# Patient Record
Sex: Female | Born: 1955 | Race: White | Hispanic: No | Marital: Married | Smoking: Never smoker
Health system: Southern US, Community
[De-identification: ages and names within clinical notes are randomized; demographics above are authoritative.]

## PROBLEM LIST (undated history)

## (undated) DIAGNOSIS — R51 Headache: Secondary | ICD-10-CM

## (undated) DIAGNOSIS — I10 Essential (primary) hypertension: Secondary | ICD-10-CM

---
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
## 1996-10-15 HISTORY — PX: OTHER SURGICAL HISTORY: SHX169

## 1997-10-15 HISTORY — PX: ABDOMINAL HYSTERECTOMY: SHX81

## 1998-10-15 HISTORY — PX: OTHER SURGICAL HISTORY: SHX169

## 1998-12-27 ENCOUNTER — Encounter: Payer: Self-pay | Admitting: Emergency Medicine

## 1998-12-27 ENCOUNTER — Emergency Department (HOSPITAL_COMMUNITY): Admission: EM | Admit: 1998-12-27 | Discharge: 1998-12-27 | Payer: Self-pay | Admitting: Emergency Medicine

## 1999-01-12 ENCOUNTER — Inpatient Hospital Stay (HOSPITAL_COMMUNITY): Admission: RE | Admit: 1999-01-12 | Discharge: 1999-01-14 | Payer: Self-pay | Admitting: Specialist

## 1999-01-12 ENCOUNTER — Encounter: Payer: Self-pay | Admitting: Specialist

## 1999-04-27 ENCOUNTER — Encounter: Admission: RE | Admit: 1999-04-27 | Discharge: 1999-07-26 | Payer: Self-pay | Admitting: Anesthesiology

## 2006-10-15 HISTORY — PX: CYSTOCELE REPAIR: SHX163

## 2009-10-15 HISTORY — PX: BACK SURGERY: SHX140

## 2009-10-15 HISTORY — PX: HERNIA REPAIR: SHX51

## 2010-08-30 ENCOUNTER — Encounter: Admission: RE | Admit: 2010-08-30 | Discharge: 2010-08-30 | Payer: Self-pay | Admitting: Specialist

## 2010-10-04 ENCOUNTER — Ambulatory Visit (HOSPITAL_COMMUNITY)
Admission: RE | Admit: 2010-10-04 | Discharge: 2010-10-05 | Payer: Self-pay | Source: Home / Self Care | Attending: Specialist | Admitting: Specialist

## 2010-12-25 LAB — COMPREHENSIVE METABOLIC PANEL
ALT: 22 U/L (ref 0–35)
AST: 17 U/L (ref 0–37)
Albumin: 3.9 g/dL (ref 3.5–5.2)
Alkaline Phosphatase: 72 U/L (ref 39–117)
Calcium: 9.6 mg/dL (ref 8.4–10.5)
Chloride: 107 mEq/L (ref 96–112)
Total Bilirubin: 0.5 mg/dL (ref 0.3–1.2)
Total Protein: 7.6 g/dL (ref 6.0–8.3)

## 2010-12-25 LAB — CBC
Hemoglobin: 13 g/dL (ref 12.0–15.0)
MCHC: 32.2 g/dL (ref 30.0–36.0)
MCV: 85.4 fL (ref 78.0–100.0)
Platelets: 244 10*3/uL (ref 150–400)
RDW: 13.9 % (ref 11.5–15.5)

## 2010-12-25 LAB — URINALYSIS, ROUTINE W REFLEX MICROSCOPIC
Bilirubin Urine: NEGATIVE
Glucose, UA: NEGATIVE mg/dL
Urobilinogen, UA: 0.2 mg/dL (ref 0.0–1.0)
pH: 5.5 (ref 5.0–8.0)

## 2010-12-25 LAB — PROTIME-INR: INR: 0.97 (ref 0.00–1.49)

## 2010-12-25 LAB — APTT: aPTT: 30 seconds (ref 24–37)

## 2011-12-24 IMAGING — CR DG LUMBAR SPINE 2-3V
2 series · 2 of 2 positions shown · non-contrast
Comparison: Mildly 08/30/2010

CLINICAL DATA: L4-5 stenosis, preop.

LUMBAR SPINE - 2-3 VIEW

[t l-spine a.p. *]
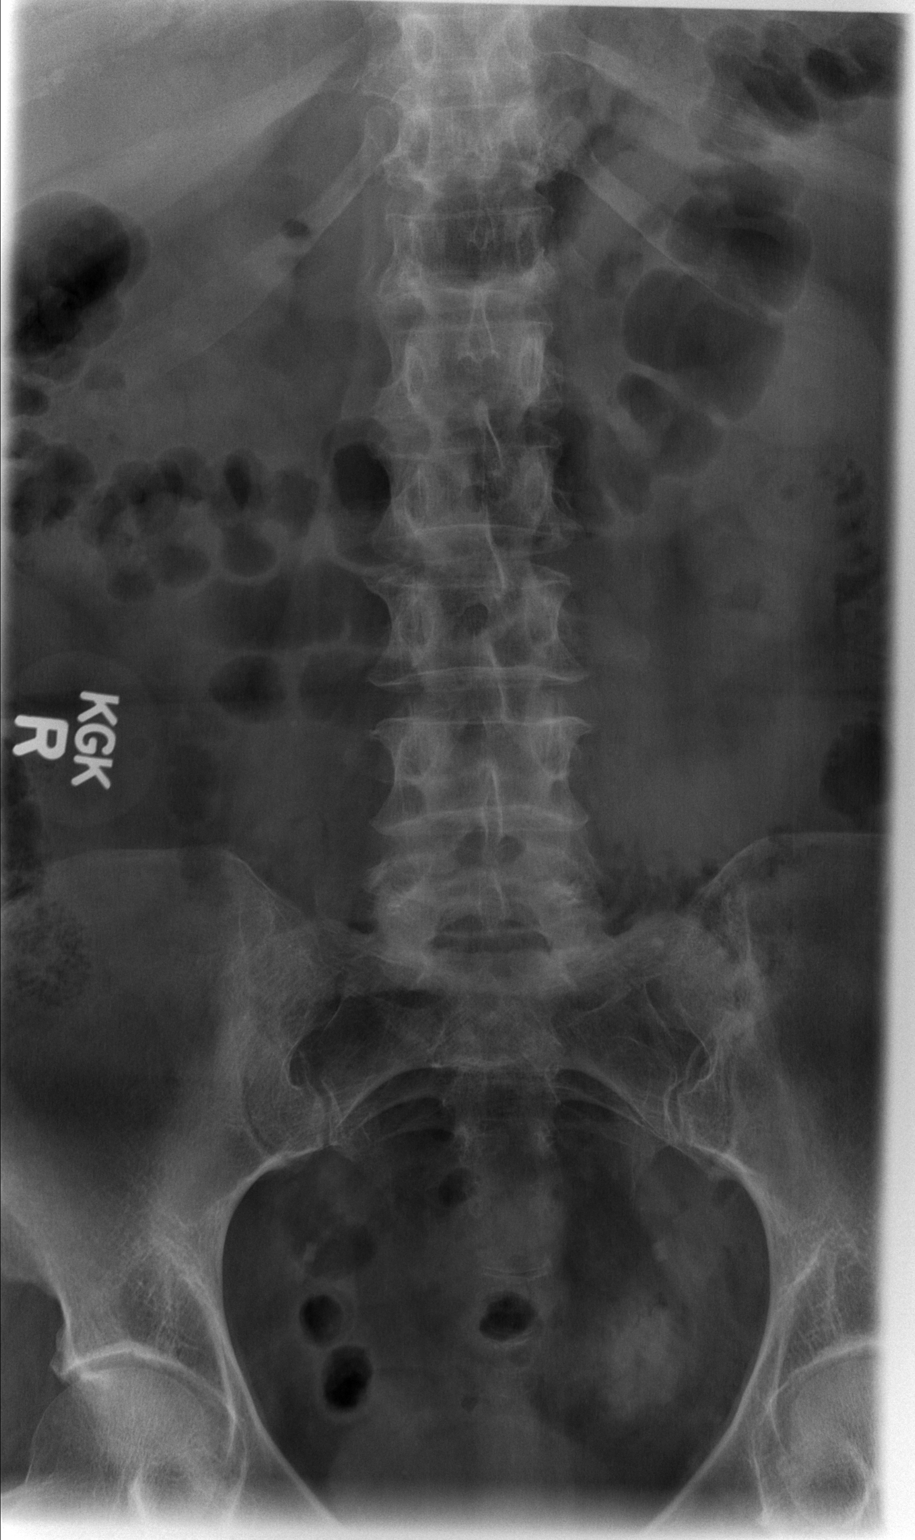

[t l-spine lat *]
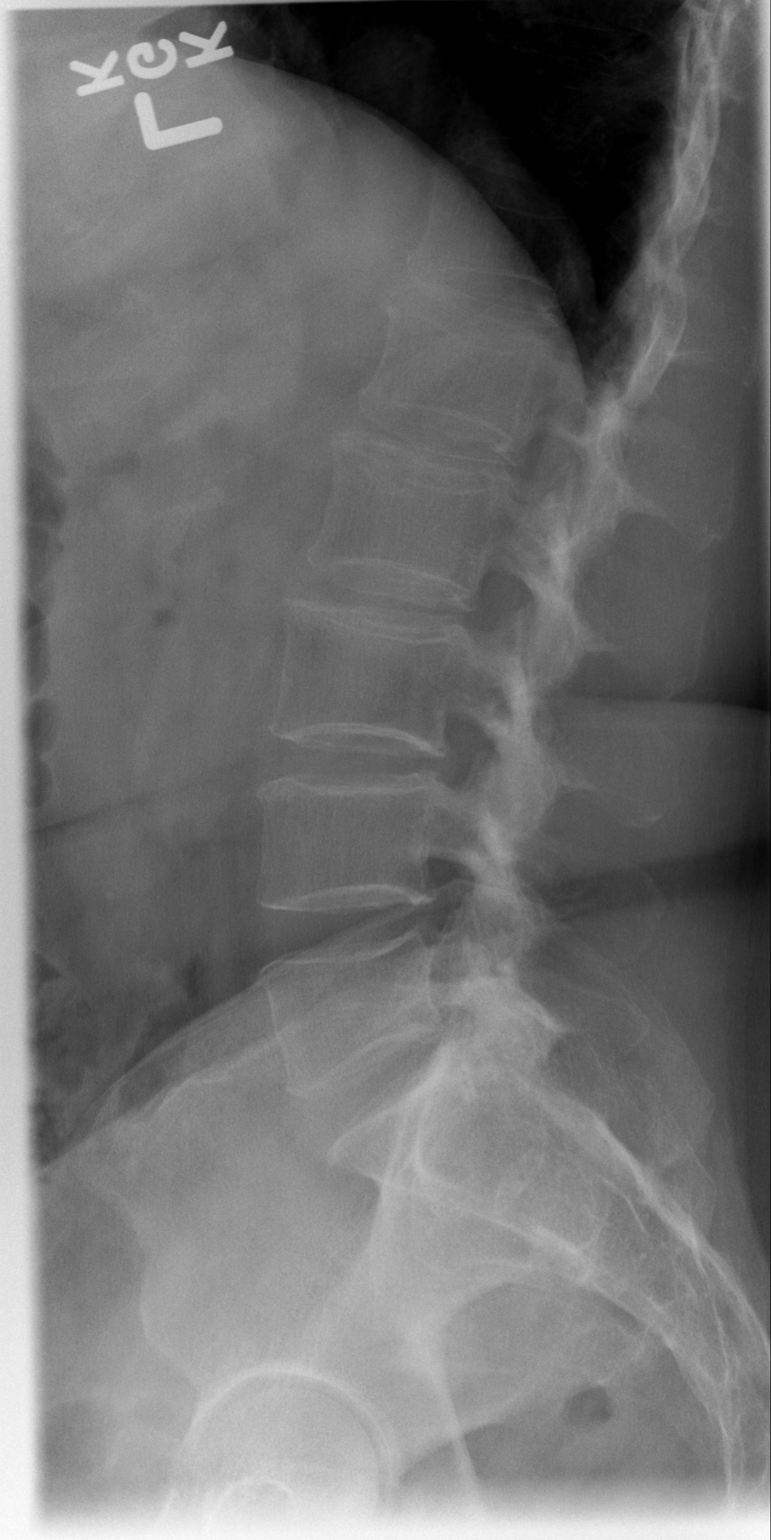

[2 of 2 positions shown; findings below may reference images not displayed]

FINDINGS: There is normal alignment.  Mild osteopenia.
Degenerative facet disease throughout the lumbar spine, most
pronounced in the lower lumbar spine.  Disc spaces are maintained.
IMPRESSION: No acute bony abnormality.  Degenerative facet disease.

## 2012-09-15 ENCOUNTER — Other Ambulatory Visit: Payer: Self-pay | Admitting: Specialist

## 2012-09-15 DIAGNOSIS — M48061 Spinal stenosis, lumbar region without neurogenic claudication: Secondary | ICD-10-CM

## 2012-09-26 ENCOUNTER — Other Ambulatory Visit: Payer: Self-pay | Admitting: Specialist

## 2012-09-26 ENCOUNTER — Inpatient Hospital Stay
Admission: RE | Admit: 2012-09-26 | Discharge: 2012-09-26 | Disposition: A | Discharge status: Home / Self Care | Payer: Self-pay | Source: Ambulatory Visit | Attending: Specialist | Admitting: Specialist

## 2012-09-26 ENCOUNTER — Ambulatory Visit
Admission: RE | Admit: 2012-09-26 | Discharge: 2012-09-26 | Disposition: A | Discharge status: Home / Self Care | Payer: BC Managed Care – PPO | Source: Ambulatory Visit | Attending: Specialist | Admitting: Specialist

## 2012-09-26 VITALS — BP 136/67 | HR 63

## 2012-09-26 DIAGNOSIS — M48061 Spinal stenosis, lumbar region without neurogenic claudication: Secondary | ICD-10-CM

## 2012-09-26 DIAGNOSIS — M549 Dorsalgia, unspecified: Secondary | ICD-10-CM

## 2012-09-26 MED ORDER — IOHEXOL 180 MG/ML  SOLN
15.0000 mL | Freq: Once | INTRAMUSCULAR | Status: AC | PRN
  Administered 2012-09-26: 15 mL via INTRATHECAL

## 2012-09-26 MED ORDER — OXYCODONE-ACETAMINOPHEN 5-325 MG PO TABS
1.0000 | ORAL_TABLET | Freq: Once | ORAL | Status: AC
  Administered 2012-09-26: 1 via ORAL

## 2012-09-26 MED ORDER — DIAZEPAM 5 MG PO TABS
10.0000 mg | ORAL_TABLET | Freq: Once | ORAL | Status: AC
  Administered 2012-09-26: 10 mg via ORAL

## 2013-02-02 ENCOUNTER — Other Ambulatory Visit: Payer: Self-pay | Admitting: Orthopedic Surgery

## 2013-02-18 ENCOUNTER — Encounter (HOSPITAL_COMMUNITY): Payer: Self-pay | Admitting: Pharmacy Technician

## 2013-02-23 ENCOUNTER — Other Ambulatory Visit (HOSPITAL_COMMUNITY): Payer: Self-pay | Admitting: *Deleted

## 2013-02-24 ENCOUNTER — Ambulatory Visit (HOSPITAL_COMMUNITY)
Admission: RE | Admit: 2013-02-24 | Discharge: 2013-02-24 | Disposition: A | Discharge status: Home / Self Care | Payer: BC Managed Care – PPO | Source: Ambulatory Visit | Attending: Orthopedic Surgery | Admitting: Orthopedic Surgery

## 2013-02-24 ENCOUNTER — Ambulatory Visit (HOSPITAL_COMMUNITY)
Admission: RE | Admit: 2013-02-24 | Discharge: 2013-02-24 | Disposition: A | Discharge status: Home / Self Care | Payer: BC Managed Care – PPO | Source: Ambulatory Visit | Attending: Specialist | Admitting: Specialist

## 2013-02-24 ENCOUNTER — Encounter (HOSPITAL_COMMUNITY)
Admission: RE | Admit: 2013-02-24 | Discharge: 2013-02-24 | Disposition: A | Discharge status: Home / Self Care | Payer: BC Managed Care – PPO | Source: Ambulatory Visit | Attending: Specialist | Admitting: Specialist

## 2013-02-24 ENCOUNTER — Encounter (HOSPITAL_COMMUNITY): Payer: Self-pay

## 2013-02-24 DIAGNOSIS — I10 Essential (primary) hypertension: Secondary | ICD-10-CM | POA: Insufficient documentation

## 2013-02-24 DIAGNOSIS — Q767 Congenital malformation of sternum: Secondary | ICD-10-CM | POA: Insufficient documentation

## 2013-02-24 DIAGNOSIS — M5137 Other intervertebral disc degeneration, lumbosacral region: Secondary | ICD-10-CM | POA: Insufficient documentation

## 2013-02-24 DIAGNOSIS — M51379 Other intervertebral disc degeneration, lumbosacral region without mention of lumbar back pain or lower extremity pain: Secondary | ICD-10-CM | POA: Insufficient documentation

## 2013-02-24 DIAGNOSIS — Z01812 Encounter for preprocedural laboratory examination: Secondary | ICD-10-CM | POA: Insufficient documentation

## 2013-02-24 DIAGNOSIS — M79609 Pain in unspecified limb: Secondary | ICD-10-CM | POA: Insufficient documentation

## 2013-02-24 DIAGNOSIS — M549 Dorsalgia, unspecified: Secondary | ICD-10-CM | POA: Insufficient documentation

## 2013-02-24 DIAGNOSIS — Q766 Other congenital malformations of ribs: Secondary | ICD-10-CM | POA: Insufficient documentation

## 2013-02-24 DIAGNOSIS — Z01818 Encounter for other preprocedural examination: Secondary | ICD-10-CM | POA: Insufficient documentation

## 2013-02-24 HISTORY — DX: Essential (primary) hypertension: I10

## 2013-02-24 HISTORY — DX: Headache: R51

## 2013-02-24 LAB — CBC
HCT: 41.2 % (ref 36.0–46.0)
Hemoglobin: 13.6 g/dL (ref 12.0–15.0)
MCH: 27.3 pg (ref 26.0–34.0)
MCV: 82.7 fL (ref 78.0–100.0)
Platelets: 266 10*3/uL (ref 150–400)
RBC: 4.98 MIL/uL (ref 3.87–5.11)
WBC: 7.5 10*3/uL (ref 4.0–10.5)

## 2013-02-24 LAB — BASIC METABOLIC PANEL
CO2: 25 mEq/L (ref 19–32)
Calcium: 9.6 mg/dL (ref 8.4–10.5)
Chloride: 106 mEq/L (ref 96–112)
Glucose, Bld: 113 mg/dL — ABNORMAL HIGH (ref 70–99)
Sodium: 140 mEq/L (ref 135–145)

## 2013-02-24 LAB — SURGICAL PCR SCREEN
MRSA, PCR: NEGATIVE
Staphylococcus aureus: NEGATIVE

## 2013-02-24 NOTE — Patient Instructions (Signed)
20      Your procedure is scheduled on:  Wednesday 03/04/2013  Report to [facility] at  0600 AM.  Call this number if you have problems the morning of surgery: [phone]   Remember:             IF YOU USE CPAP,BRING MASK AND TUBING AM OF SURGERY!   Do not eat food or drink liquids AFTER MIDNIGHT!  Take these medicines the morning of surgery with A SIP OF WATER: Carvedilol   Do not bring valuables to the hospital.  .  Leave suitcase in the car. After surgery it may be brought to your room.  For patients admitted to the hospital, checkout time is 11:00 AM the day of              Discharge.    DO NOT WEAR JEWELRY , MAKE-UP, LOTIONS,POWDERS,PERFUMES!             WOMEN -DO NOT SHAVE LEGS OR UNDERARMS 12 HRS. BEFORE  SURGERY!               MEN MAY SHAVE AS USUAL!             CONTACTS,DENTURES OR BRIDGEWORK, FALSE EYELASHES MAY   NOT BE WORN INTO SURGERY!                                           Patients discharged the day of surgery will not be allowed to drive home.  If going home the same day of surgery, must have someone stay with you first 24 hrs.at home and arrange for someone to drive you home from the Hospital.                          YOUR DRIVER ZO:[id number]   Special Instructions:             Please read over the following fact sheets that you were given:             1. [place] PREPARING FOR SURGERY SHEET              2.[facility] INFORMATION              3.[facility]                                        Telford Nab.Jahzeel Poythress,RN,BSN     [phone]                FAILURE TO FOLLOW THESE INSTRUCTIONS MAY RESULT IN   CANCELLATION OF YOUR SURGERY!               Patient Signature:___________________________

## 2013-02-24 NOTE — Progress Notes (Signed)
EKG from [facility] 01/27/2013 and office note from [place], [place] 01/27/2013 all on chart.

## 2013-03-02 ENCOUNTER — Other Ambulatory Visit: Payer: Self-pay | Admitting: Orthopedic Surgery

## 2013-03-02 NOTE — H&P (Signed)
Betty Garrett is an 57 y.o. female.   Chief Complaint: back and left leg pain HPI: The patient is a 57 year old female c/o years of LBP. S/p prior decompression by Dr. Beane 09/2010- B/L hemilaminotomies L5-S1 and B/L lateral recess decompression with foraminotomies L5. Symptoms reported today include: pain (low back), numbness (and tingling left leg), burning (left ankle) and leg pain (left). The following medication has been used for pain control: Norco or Tylenol prn. The patient reports their current pain level to be severe. The patient states that she was unable to tolerate the Lyrica. ESI L3-4 left gave slight relief for twenty minutes. SNRB prior to that with no relief.  The patient continues to have left lower extremity radicular pain in the L3 and occasionally in the L4 nerve root distribution. She has reported two years of symptoms.  Symptoms refractory to ESI, SNRB, HEP, NSAIDs, pain medications, activity modifications, relative rest.  Past Medical History  Diagnosis Date  . Hypertension   . Headache     Past Surgical History  Procedure Laterality Date  . Breast surgery  1976    cyst -benign  . Back surgery  2011  . Hernia repair  2011    umbilical  . Abdominal hysterectomy  1999    partial  . Cystocele repair  2008    and rectocele  . Left hand surgery  2000  . Right foot surgery  1998    left bone spur surgery    No family history on file. Social History:  reports that she does not drink alcohol or use illicit drugs. Her tobacco history is not on file.  Allergies: No Known Allergies   (Not in a hospital admission)  No results found for this or any previous visit (from the past 48 hour(s)). No results found.  Review of Systems  Constitutional: Negative.   HENT: Negative.   Eyes: Negative.   Respiratory: Negative.   Cardiovascular: Negative.   Gastrointestinal: Negative.   Genitourinary: Negative.   Musculoskeletal: Positive for back pain.  Skin:  Negative.   Neurological: Positive for sensory change and focal weakness.  Endo/Heme/Allergies: Negative.   Psychiatric/Behavioral: Negative.     There were no vitals taken for this visit. Physical Exam  Constitutional: She is oriented to person, place, and time. She appears well-developed and well-nourished.  HENT:  Head: Normocephalic and atraumatic.  Eyes: Conjunctivae and EOM are normal. Pupils are equal, round, and reactive to light.  Neck: Normal range of motion. Neck supple.  Cardiovascular: Normal rate and regular rhythm.   Respiratory: Effort normal and breath sounds normal.  GI: Soft. Bowel sounds are normal.  Musculoskeletal:  On exam straight leg raise and femoral stretch is positive. She walks with an antalgic gait. Slight quadriceps weakness on the left compared to the right. Pain with extension.  Lumbar spine exam reveals no evidence of soft tissue swelling, no evidence of soft tissue swelling or deformity or skin ecchymosis. On palpation there is no tenderness of the lumbar spine. No flank pain with percussion. The abdomen is soft and nontender. Nontender over the trochanters. No cellulitis or lymphadenopathy.  Motor is 5/5 including EHL, tibialis anterior, plantarflexion, quadriceps and hamstrings. The patient is normoreflexic. There is no Babinski or clonus. Sensory exam is intact to light touch. The patient has good distal pulses. No DVT. No pain and normal range of motion without instability of the hips, knees and ankles.  Neurological: She is alert and oriented to person, place, and   time. She has normal reflexes.  Skin: Skin is warm and dry.  Psychiatric: She has a normal mood and affect.    Three view radiographs demonstrate mild degenerative changes at 3-4 and 4-5. No instability in flexion and extension. Her MRI showed small disc herniation at 3-4 contacting the 3 ganglion. No foraminal narrowing. Small disc protrusion contacting the 4 root. Laminotomy noted  at 4-5. Her previous myelogram that was performed does demonstrate focal disc herniation at 3-4 extending into the extra foraminal tissues, likely compressing the 3 root due to neuroforaminal narrowing.  MRI demonstrates lateral recess stenosis at 4-5, previous hemilaminotomy defects are noted. Foraminal disc at L3-4.  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 She has had two epidurals at 3-4, selective nerve root block and epidural, both have given her temporary relief of her symptoms, especially the epidural injection. She has minimal back pain at this point in time. She has no hip pain. No SI joint pain. The pelvis is stable. She kept a pain diary and says the postoperative level was eight out of ten of her back, but had an absence of leg pain. It does radiate down to the L4 nerve root distribution on the medial aspect of her ankle and classically in L3.  We discussed living with her symptoms, seeing pain management, versus possibility of decompression.  I had an extensive discussion of the risks and benefits of lumbar decompression with the patient including bleeding, infection, damage to neurovascular structures, epidural fibrosis, CSF leakage requiring repair. We also discussed increase in pain, adjacent segment disease, recurrent disc herniation, need for future surgery including repeat decompression and/or fusion. We also discussed risks of postoperative hematoma, paralysis, anesthetic complications including DVT, PE, death, cardiopulmonary dysfunction. In addition, the perioperative and postoperative courses were discussed in detail including the rehabilitative time and return to functional activity and work. I provided the patient with an illustrated handout and utilized the appropriate surgical models.  Again, classically L3 and L4 on exam. The previous  myelogram and MRI, discussed essentially at 3-4 and the foramen at 3. We did, however, discuss the possibility of residual symptoms no matter what was performed, though, again, this has been a couple years and she is disabled. Her husband is here. She has residual symptoms we discussed as well, no choice but to restrict her activities. She works and spends a lot of time on her feet. Discussed one other option to restrict her without the procedure and that would be efficacious, she says even when she is away from work for a week or the weekend her symptoms persist. We will proceed accordingly.  Plan microlumbar decompression L3-4    Betty Sciarra M. for Dr. Beane 03/02/2013, 8:51 AM    

## 2013-03-04 ENCOUNTER — Encounter (HOSPITAL_COMMUNITY): Payer: Self-pay | Admitting: *Deleted

## 2013-03-04 ENCOUNTER — Ambulatory Visit (HOSPITAL_COMMUNITY)
Admission: RE | Admit: 2013-03-04 | Discharge: 2013-03-05 | Disposition: A | Discharge status: Home / Self Care | Payer: BC Managed Care – PPO | Source: Ambulatory Visit | Attending: Specialist | Admitting: Specialist

## 2013-03-04 ENCOUNTER — Ambulatory Visit (HOSPITAL_COMMUNITY): Payer: BC Managed Care – PPO

## 2013-03-04 ENCOUNTER — Encounter (HOSPITAL_COMMUNITY): Payer: Self-pay | Admitting: Certified Registered Nurse Anesthetist

## 2013-03-04 ENCOUNTER — Ambulatory Visit (HOSPITAL_COMMUNITY): Payer: BC Managed Care – PPO | Admitting: Certified Registered Nurse Anesthetist

## 2013-03-04 ENCOUNTER — Encounter (HOSPITAL_COMMUNITY)
Admission: RE | Disposition: A | Discharge status: Home / Self Care | Payer: Self-pay | Source: Ambulatory Visit | Attending: Specialist

## 2013-03-04 DIAGNOSIS — I10 Essential (primary) hypertension: Secondary | ICD-10-CM | POA: Insufficient documentation

## 2013-03-04 DIAGNOSIS — R51 Headache: Secondary | ICD-10-CM | POA: Insufficient documentation

## 2013-03-04 DIAGNOSIS — M5126 Other intervertebral disc displacement, lumbar region: Secondary | ICD-10-CM

## 2013-03-04 HISTORY — PX: LUMBAR LAMINECTOMY/DECOMPRESSION MICRODISCECTOMY: SHX5026

## 2013-03-04 SURGERY — LUMBAR LAMINECTOMY/DECOMPRESSION MICRODISCECTOMY 1 LEVEL
Anesthesia: General | Site: Back | Wound class: Clean

## 2013-03-04 MED ORDER — METHOCARBAMOL 500 MG PO TABS
500.0000 mg | ORAL_TABLET | Freq: Four times a day (QID) | ORAL | Status: DC | PRN
  Administered 2013-03-04 – 2013-03-05 (×2): 500 mg via ORAL
  Filled 2013-03-04 (×2): qty 1

## 2013-03-04 MED ORDER — OXYCODONE-ACETAMINOPHEN 5-325 MG PO TABS
1.0000 | ORAL_TABLET | ORAL | Status: DC | PRN
Start: 2013-03-04 — End: 2013-03-05

## 2013-03-04 MED ORDER — CEFAZOLIN SODIUM-DEXTROSE 2-3 GM-% IV SOLR
2.0000 g | Freq: Three times a day (TID) | INTRAVENOUS | Status: AC
  Administered 2013-03-04 (×2): 2 g via INTRAVENOUS
  Filled 2013-03-04 (×4): qty 50

## 2013-03-04 MED ORDER — SODIUM CHLORIDE 0.45 % IV SOLN
INTRAVENOUS | Status: DC
  Administered 2013-03-04: 14:00:00 via INTRAVENOUS

## 2013-03-04 MED ORDER — DEXAMETHASONE SODIUM PHOSPHATE 10 MG/ML IJ SOLN
INTRAMUSCULAR | Status: DC | PRN
  Administered 2013-03-04: 10 mg via INTRAVENOUS

## 2013-03-04 MED ORDER — METHOCARBAMOL 500 MG PO TABS: 500.0000 mg | ORAL_TABLET | Freq: Three times a day (TID) | ORAL | Status: AC

## 2013-03-04 MED ORDER — CHLORHEXIDINE GLUCONATE 4 % EX LIQD
60.0000 mL | Freq: Once | CUTANEOUS | Status: DC
  Filled 2013-03-04: qty 60

## 2013-03-04 MED ORDER — SUCCINYLCHOLINE CHLORIDE 20 MG/ML IJ SOLN
INTRAMUSCULAR | Status: DC | PRN
  Administered 2013-03-04: 100 mg via INTRAVENOUS

## 2013-03-04 MED ORDER — CEFAZOLIN SODIUM-DEXTROSE 2-3 GM-% IV SOLR
2.0000 g | INTRAVENOUS | Status: AC
  Administered 2013-03-04: 2 g via INTRAVENOUS

## 2013-03-04 MED ORDER — ROCURONIUM BROMIDE 100 MG/10ML IV SOLN
INTRAVENOUS | Status: DC | PRN
  Administered 2013-03-04: 35 mg via INTRAVENOUS

## 2013-03-04 MED ORDER — ACETAMINOPHEN 325 MG PO TABS: 650.0000 mg | ORAL_TABLET | ORAL | Status: DC | PRN

## 2013-03-04 MED ORDER — ONDANSETRON HCL 4 MG/2ML IJ SOLN: 4.0000 mg | INTRAMUSCULAR | Status: DC | PRN

## 2013-03-04 MED ORDER — NEOSTIGMINE METHYLSULFATE 1 MG/ML IJ SOLN
INTRAMUSCULAR | Status: DC | PRN
  Administered 2013-03-04: 5 mg via INTRAVENOUS

## 2013-03-04 MED ORDER — HYDROCODONE-ACETAMINOPHEN 7.5-325 MG PO TABS: 1.0000 | ORAL_TABLET | ORAL | Status: AC | PRN

## 2013-03-04 MED ORDER — DOCUSATE SODIUM 100 MG PO CAPS
100.0000 mg | ORAL_CAPSULE | Freq: Two times a day (BID) | ORAL | Status: DC
  Administered 2013-03-04 – 2013-03-05 (×2): 100 mg via ORAL
  Filled 2013-03-04 (×3): qty 1

## 2013-03-04 MED ORDER — OXYCODONE HCL 5 MG PO TABS: 5.0000 mg | ORAL_TABLET | Freq: Once | ORAL | Status: DC | PRN

## 2013-03-04 MED ORDER — MEPERIDINE HCL 50 MG/ML IJ SOLN: 6.2500 mg | INTRAMUSCULAR | Status: DC | PRN

## 2013-03-04 MED ORDER — SODIUM CHLORIDE 0.9 % IR SOLN
Status: DC | PRN
  Administered 2013-03-04: 09:00:00

## 2013-03-04 MED ORDER — OXYCODONE HCL 5 MG/5ML PO SOLN: 5.0000 mg | Freq: Once | ORAL | Status: DC | PRN

## 2013-03-04 MED ORDER — EPHEDRINE SULFATE 50 MG/ML IJ SOLN
INTRAMUSCULAR | Status: DC | PRN
  Administered 2013-03-04 (×2): 10 mg via INTRAVENOUS
  Administered 2013-03-04: 5 mg via INTRAVENOUS

## 2013-03-04 MED ORDER — FENTANYL CITRATE 0.05 MG/ML IJ SOLN
INTRAMUSCULAR | Status: DC | PRN
  Administered 2013-03-04 (×2): 50 ug via INTRAVENOUS
  Administered 2013-03-04 (×2): 25 ug via INTRAVENOUS
  Administered 2013-03-04: 50 ug via INTRAVENOUS

## 2013-03-04 MED ORDER — SODIUM CHLORIDE 0.9 % IJ SOLN: 3.0000 mL | Freq: Two times a day (BID) | INTRAMUSCULAR | Status: DC

## 2013-03-04 MED ORDER — PHENYLEPHRINE HCL 10 MG/ML IJ SOLN
INTRAMUSCULAR | Status: DC | PRN
  Administered 2013-03-04: 40 ug via INTRAVENOUS
  Administered 2013-03-04 (×2): 80 ug via INTRAVENOUS

## 2013-03-04 MED ORDER — MENTHOL 3 MG MT LOZG: 1.0000 | LOZENGE | OROMUCOSAL | Status: DC | PRN

## 2013-03-04 MED ORDER — ACETAMINOPHEN 650 MG RE SUPP
650.0000 mg | RECTAL | Status: DC | PRN
  Filled 2013-03-04: qty 1

## 2013-03-04 MED ORDER — HYDROMORPHONE HCL PF 1 MG/ML IJ SOLN
0.2500 mg | INTRAMUSCULAR | Status: DC | PRN
  Administered 2013-03-04 (×4): 0.5 mg via INTRAVENOUS

## 2013-03-04 MED ORDER — GLYCOPYRROLATE 0.2 MG/ML IJ SOLN
INTRAMUSCULAR | Status: DC | PRN
  Administered 2013-03-04: 0.6 mg via INTRAVENOUS

## 2013-03-04 MED ORDER — METHOCARBAMOL 100 MG/ML IJ SOLN
500.0000 mg | Freq: Four times a day (QID) | INTRAVENOUS | Status: DC | PRN
  Filled 2013-03-04: qty 5

## 2013-03-04 MED ORDER — CARVEDILOL 6.25 MG PO TABS
6.2500 mg | ORAL_TABLET | Freq: Two times a day (BID) | ORAL | Status: DC
  Administered 2013-03-04: 6.25 mg via ORAL
  Filled 2013-03-04 (×4): qty 1

## 2013-03-04 MED ORDER — HYDROCODONE-ACETAMINOPHEN 5-325 MG PO TABS
1.0000 | ORAL_TABLET | ORAL | Status: DC | PRN
  Administered 2013-03-04: 2 via ORAL
  Administered 2013-03-05: 1 via ORAL
  Administered 2013-03-05: 2 via ORAL
  Filled 2013-03-04 (×2): qty 2
  Filled 2013-03-04: qty 1

## 2013-03-04 MED ORDER — MIDAZOLAM HCL 5 MG/5ML IJ SOLN
INTRAMUSCULAR | Status: DC | PRN
  Administered 2013-03-04: 2 mg via INTRAVENOUS

## 2013-03-04 MED ORDER — ONDANSETRON HCL 4 MG/2ML IJ SOLN
INTRAMUSCULAR | Status: DC | PRN
  Administered 2013-03-04: 4 mg via INTRAVENOUS

## 2013-03-04 MED ORDER — LIDOCAINE HCL 4 % MT SOLN
OROMUCOSAL | Status: DC | PRN
  Administered 2013-03-04: 4 mL via TOPICAL

## 2013-03-04 MED ORDER — THROMBIN 5000 UNITS EX SOLR
CUTANEOUS | Status: DC | PRN
  Administered 2013-03-04: 5000 [IU] via TOPICAL

## 2013-03-04 MED ORDER — HYDROMORPHONE HCL PF 1 MG/ML IJ SOLN
0.5000 mg | INTRAMUSCULAR | Status: DC | PRN
  Administered 2013-03-04 (×2): 0.5 mg via INTRAVENOUS
  Filled 2013-03-04 (×2): qty 1

## 2013-03-04 MED ORDER — PROPOFOL 10 MG/ML IV BOLUS
INTRAVENOUS | Status: DC | PRN
  Administered 2013-03-04: 150 mg via INTRAVENOUS

## 2013-03-04 MED ORDER — PROMETHAZINE HCL 25 MG/ML IJ SOLN: 6.2500 mg | INTRAMUSCULAR | Status: DC | PRN

## 2013-03-04 MED ORDER — ACETAMINOPHEN 10 MG/ML IV SOLN
INTRAVENOUS | Status: DC | PRN
  Administered 2013-03-04: 1000 mg via INTRAVENOUS

## 2013-03-04 MED ORDER — ACETAMINOPHEN 10 MG/ML IV SOLN: 1000.0000 mg | Freq: Once | INTRAVENOUS | Status: DC | PRN

## 2013-03-04 MED ORDER — SODIUM CHLORIDE 0.9 % IJ SOLN: 3.0000 mL | INTRAMUSCULAR | Status: DC | PRN

## 2013-03-04 MED ORDER — BUPIVACAINE-EPINEPHRINE 0.5% -1:200000 IJ SOLN
INTRAMUSCULAR | Status: DC | PRN
  Administered 2013-03-04: 14 mL

## 2013-03-04 MED ORDER — PHENOL 1.4 % MT LIQD: 1.0000 | OROMUCOSAL | Status: DC | PRN

## 2013-03-04 MED ORDER — LACTATED RINGERS IV SOLN
INTRAVENOUS | Status: DC
  Administered 2013-03-04: 08:00:00 via INTRAVENOUS

## 2013-03-04 MED ORDER — SODIUM CHLORIDE 0.9 % IV SOLN: 250.0000 mL | INTRAVENOUS | Status: DC

## 2013-03-04 SURGICAL SUPPLY — 52 items
BAG ZIPLOCK 12X15 (MISCELLANEOUS) ×2 IMPLANT
BENZOIN TINCTURE PRP APPL 2/3 (GAUZE/BANDAGES/DRESSINGS) ×4 IMPLANT
CHLORAPREP W/TINT 26ML (MISCELLANEOUS) IMPLANT
CLEANER TIP ELECTROSURG 2X2 (MISCELLANEOUS) ×2 IMPLANT
CLOSURE STERI-STRIP 1/4X4 (GAUZE/BANDAGES/DRESSINGS) ×2 IMPLANT
CLOTH 2% CHLOROHEXIDINE 3PK (PERSONAL CARE ITEMS) ×2 IMPLANT
CLOTH BEACON ORANGE TIMEOUT ST (SAFETY) ×2 IMPLANT
DECANTER SPIKE VIAL GLASS SM (MISCELLANEOUS) ×2 IMPLANT
DRAPE MICROSCOPE LEICA (MISCELLANEOUS) ×2 IMPLANT
DRAPE POUCH INSTRU U-SHP 10X18 (DRAPES) ×2 IMPLANT
DRAPE SURG 17X11 SM STRL (DRAPES) ×2 IMPLANT
DRSG AQUACEL AG ADV 3.5X 4 (GAUZE/BANDAGES/DRESSINGS) ×2 IMPLANT
DRSG EMULSION OIL 3X3 NADH (GAUZE/BANDAGES/DRESSINGS) IMPLANT
DRSG PAD ABDOMINAL 8X10 ST (GAUZE/BANDAGES/DRESSINGS) IMPLANT
DRSG TELFA 4X5 ISLAND ADH (GAUZE/BANDAGES/DRESSINGS) IMPLANT
DURAPREP 26ML APPLICATOR (WOUND CARE) ×2 IMPLANT
ELECT BLADE TIP CTD 4 INCH (ELECTRODE) ×2 IMPLANT
ELECT REM PT RETURN 9FT ADLT (ELECTROSURGICAL) ×2
ELECTRODE REM PT RTRN 9FT ADLT (ELECTROSURGICAL) ×1 IMPLANT
GLOVE BIOGEL PI IND STRL 7.5 (GLOVE) ×1 IMPLANT
GLOVE BIOGEL PI IND STRL 8 (GLOVE) ×1 IMPLANT
GLOVE BIOGEL PI INDICATOR 7.5 (GLOVE) ×1
GLOVE BIOGEL PI INDICATOR 8 (GLOVE) ×1
GLOVE SURG SS PI 7.5 STRL IVOR (GLOVE) ×2 IMPLANT
GLOVE SURG SS PI 8.0 STRL IVOR (GLOVE) ×4 IMPLANT
GOWN PREVENTION PLUS LG XLONG (DISPOSABLE) ×2 IMPLANT
GOWN STRL REIN XL XLG (GOWN DISPOSABLE) ×4 IMPLANT
KIT BASIN OR (CUSTOM PROCEDURE TRAY) ×2 IMPLANT
KIT POSITIONING SURG ANDREWS (MISCELLANEOUS) ×2 IMPLANT
MANIFOLD NEPTUNE II (INSTRUMENTS) ×2 IMPLANT
NEEDLE SPNL 18GX3.5 QUINCKE PK (NEEDLE) ×6 IMPLANT
PATTIES SURGICAL .5 X.5 (GAUZE/BANDAGES/DRESSINGS) IMPLANT
PATTIES SURGICAL .75X.75 (GAUZE/BANDAGES/DRESSINGS) IMPLANT
PATTIES SURGICAL 1X1 (DISPOSABLE) IMPLANT
SPONGE LAP 18X18 X RAY DECT (DISPOSABLE) ×2 IMPLANT
SPONGE SURGIFOAM ABS GEL 100 (HEMOSTASIS) ×2 IMPLANT
STAPLER VISISTAT (STAPLE) IMPLANT
STRIP CLOSURE SKIN 1/2X4 (GAUZE/BANDAGES/DRESSINGS) ×2 IMPLANT
SUT PROLENE 3 0 PS 2 (SUTURE) ×2 IMPLANT
SUT VIC AB 0 CT1 27 (SUTURE)
SUT VIC AB 0 CT1 27XBRD ANTBC (SUTURE) IMPLANT
SUT VIC AB 1 CT1 27 (SUTURE)
SUT VIC AB 1 CT1 27XBRD ANTBC (SUTURE) IMPLANT
SUT VIC AB 1-0 CT2 27 (SUTURE) ×2 IMPLANT
SUT VIC AB 2-0 CT1 27 (SUTURE) ×1
SUT VIC AB 2-0 CT1 TAPERPNT 27 (SUTURE) ×1 IMPLANT
SUT VIC AB 2-0 CT2 27 (SUTURE) IMPLANT
SUT VICRYL 0 27 CT2 27 ABS (SUTURE) IMPLANT
SUT VICRYL 0 UR6 27IN ABS (SUTURE) IMPLANT
SYRINGE 10CC LL (SYRINGE) ×2 IMPLANT
TRAY LAMINECTOMY (CUSTOM PROCEDURE TRAY) ×2 IMPLANT
YANKAUER SUCT BULB TIP NO VENT (SUCTIONS) ×2 IMPLANT

## 2013-03-04 NOTE — Op Note (Signed)
NAMECHRISTLE, Betty Garrett                ACCOUNT NO.:  [id number]  MEDICAL RECORD NO.:  [id number]  LOCATION:  1611                         FACILITY:  [facility]  PHYSICIAN:  Jene Every, M.D.    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  PROCEDURE PERFORMED:  Microlumbar decompression at L3-4 left, and central with foraminotomies at L3-L4 left and microdiskectomy at L3-4 left.  ANESTHESIA:  General.  ASSISTANT:  Lanna Poche, PA-C  HISTORY:  A 57 year old, foraminal disk herniation at L3-4, central and to the left.  She had L3 and L4 nerve root radiculopathy, neural tension signs, weakness despite rest, activity modification, had a diagnostic injection with temporary relief, indicated for decompression, myelogram indicating displacement at 4-5 root.  Risk and benefits were discussed including bleeding, infection, damage to neurovascular structures, DVT, PE, and anesthetic complications, etc.  TECHNIQUE:  With the patient in supine position, after induction of adequate general anesthesia, 2 g Kefzol, placed prone on the Alcova frame.  All bony prominences were well padded.  Lumbar region was prepped and draped in usual sterile fashion.  Two 18-gauge spinal needle was utilized to localize the L3-4 interspace and confirmed with x-ray. Incision was made from spinous processes of L3 to above L4. Subcutaneous tissue was dissected, electrocautery was utilized to achieve hemostasis.  Due to the narrow interlaminar window on the left, we decided to go centrally that preserved the pars and the facet. McCullough retractor was placed.  Operating microscope was draped and brought on the surgical field.  We removed the interspinous  ligament, portion of the spinous process of L4.  Performed hemilaminotomy of the caudad edge of L3 bilaterally and then removed ligamentum flavum from the 3-4 interspace central and to the left.  We decompressed the lateral recess to medial border of the pedicle after identifying the L3 and L4 roots protecting them.  Vascular lesion was noted.  Bipolar electrocautery was utilized to achieve hemostasis.  Disk herniation was noted extending into the foramen.  I performed an annulotomy.  Copious portion of disk material was removed from the disk space on both sides of the operating table.  They were further mobilized with a Woodson retractor, therefore out into the foramen.  After full diskectomy of herniated material, hockey-stick probe was passed freely up the foramen of 4 and 3.  No evidence of compression of the 4 or 3 root.  Checked beneath the thecal sac, the axilla of the root.  No residual compression.  We obtained a confirmatory radiograph.  We then copiously irrigated the disk space with antibiotic irrigation, placed thrombin- soaked Gelfoam and laminotomy defect.  Removed the retractors, paraspinous muscle was inspected and no evidence of active bleeding. There was no CSF leakage.  We closed the dorsolumbar fascia with 1 Vicryl interrupted figure-of-eight sutures, subcu with 2-0, skin with 4- 0 subcuticular Prolene.  Wound was reinforced with Steri-Strips.  Sterile dressing was applied.  Placed supine on the hospital bed, extubated  without difficulty, and transported to the recovery room in satisfactory condition.  The patient tolerated the procedure well.  No complications.  Minimal blood loss.  Assistant, Lanna Poche, [place].     Jene Every, M.D.     Cordelia Pen  D:  03/04/2013  T:  03/04/2013  Job:  [id number]

## 2013-03-04 NOTE — Anesthesia Postprocedure Evaluation (Signed)
Anesthesia Post Note  Patient: EMAAN GARY  Procedure(s) Performed: Procedure(s) (LRB): MICRO LUMBAR DECOMPRESSION  L3-L4  (N/A)  Anesthesia type: General  Patient location: PACU  Post pain: Pain level controlled  Post assessment: Post-op Vital signs reviewed  Last Vitals: BP 129/75  Pulse 71  Temp(Src) 36.8 C (Oral)  Resp 14  Ht 5' (1.524 m)  Wt 201 lb 3.2 oz (91.264 kg)  BMI 39.29 kg/m2  SpO2 95%  Post vital signs: Reviewed  Level of consciousness: sedated  Complications: No apparent anesthesia complications

## 2013-03-04 NOTE — H&P (View-Only) (Signed)
Betty Garrett is an 57 y.o. female.   Chief Complaint: back and left leg pain HPI: The patient is a 57 year old female c/o years of LBP. S/p prior decompression by Dr. Shelle Iron 09/2010- B/L hemilaminotomies L5-S1 and B/L lateral recess decompression with foraminotomies L5. Symptoms reported today include: pain (low back), numbness (and tingling left leg), burning (left ankle) and leg pain (left). The following medication has been used for pain control: Norco or Tylenol prn. The patient reports their current pain level to be severe. The patient states that she was unable to tolerate the Lyrica. ESI L3-4 left gave slight relief for twenty minutes. SNRB prior to that with no relief.  The patient continues to have left lower extremity radicular pain in the L3 and occasionally in the L4 nerve root distribution. She has reported two years of symptoms.  Symptoms refractory to ESI, SNRB, HEP, NSAIDs, pain medications, activity modifications, relative rest.  Past Medical History  Diagnosis Date  . Hypertension   . Headache     Past Surgical History  Procedure Laterality Date  . Breast surgery  1976    cyst -benign  . Back surgery  2011  . Hernia repair  2011    umbilical  . Abdominal hysterectomy  1999    partial  . Cystocele repair  2008    and rectocele  . Left hand surgery  2000  . Right foot surgery  1998    left bone spur surgery    No family history on file. Social History:  reports that she does not drink alcohol or use illicit drugs. Her tobacco history is not on file.  Allergies: No Known Allergies   (Not in a hospital admission)  No results found for this or any previous visit (from the past 48 hour(s)). No results found.  Review of Systems  Constitutional: Negative.   HENT: Negative.   Eyes: Negative.   Respiratory: Negative.   Cardiovascular: Negative.   Gastrointestinal: Negative.   Genitourinary: Negative.   Musculoskeletal: Positive for back pain.  Skin:  Negative.   Neurological: Positive for sensory change and focal weakness.  Endo/Heme/Allergies: Negative.   Psychiatric/Behavioral: Negative.     There were no vitals taken for this visit. Physical Exam  Constitutional: She is oriented to person, place, and time. She appears well-developed and well-nourished.  HENT:  Head: Normocephalic and atraumatic.  Eyes: Conjunctivae and EOM are normal. Pupils are equal, round, and reactive to light.  Neck: Normal range of motion. Neck supple.  Cardiovascular: Normal rate and regular rhythm.   Respiratory: Effort normal and breath sounds normal.  GI: Soft. Bowel sounds are normal.  Musculoskeletal:  On exam straight leg raise and femoral stretch is positive. She walks with an antalgic gait. Slight quadriceps weakness on the left compared to the right. Pain with extension.  Lumbar spine exam reveals no evidence of soft tissue swelling, no evidence of soft tissue swelling or deformity or skin ecchymosis. On palpation there is no tenderness of the lumbar spine. No flank pain with percussion. The abdomen is soft and nontender. Nontender over the trochanters. No cellulitis or lymphadenopathy.  Motor is 5/5 including EHL, tibialis anterior, plantarflexion, quadriceps and hamstrings. The patient is normoreflexic. There is no Babinski or clonus. Sensory exam is intact to light touch. The patient has good distal pulses. No DVT. No pain and normal range of motion without instability of the hips, knees and ankles.  Neurological: She is alert and oriented to person, place, and  time. She has normal reflexes.  Skin: Skin is warm and dry.  Psychiatric: She has a normal mood and affect.    Three view radiographs demonstrate mild degenerative changes at 3-4 and 4-5. No instability in flexion and extension. Her MRI showed small disc herniation at 3-4 contacting the 3 ganglion. No foraminal narrowing. Small disc protrusion contacting the 4 root. Laminotomy noted  at 4-5. Her previous myelogram that was performed does demonstrate focal disc herniation at 3-4 extending into the extra foraminal tissues, likely compressing the 3 root due to neuroforaminal narrowing.  MRI demonstrates lateral recess stenosis at 4-5, previous hemilaminotomy defects are noted. Foraminal disc at L3-4.  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 She has had two epidurals at 3-4, selective nerve root block and epidural, both have given her temporary relief of her symptoms, especially the epidural injection. She has minimal back pain at this point in time. She has no hip pain. No SI joint pain. The pelvis is stable. She kept a pain diary and says the postoperative level was eight out of ten of her back, but had an absence of leg pain. It does radiate down to the L4 nerve root distribution on the medial aspect of her ankle and classically in L3.  We discussed living with her symptoms, seeing pain management, versus possibility of decompression.  I had an extensive discussion of the risks and benefits of lumbar decompression with the patient including bleeding, infection, damage to neurovascular structures, epidural fibrosis, CSF leakage requiring repair. We also discussed increase in pain, adjacent segment disease, recurrent disc herniation, need for future surgery including repeat decompression and/or fusion. We also discussed risks of postoperative hematoma, paralysis, anesthetic complications including DVT, PE, death, cardiopulmonary dysfunction. In addition, the perioperative and postoperative courses were discussed in detail including the rehabilitative time and return to functional activity and work. I provided the patient with an illustrated handout and utilized the appropriate surgical models.  Again, classically L3 and L4 on exam. The previous  myelogram and MRI, discussed essentially at 3-4 and the foramen at 3. We did, however, discuss the possibility of residual symptoms no matter what was performed, though, again, this has been a couple years and she is disabled. Her husband is here. She has residual symptoms we discussed as well, no choice but to restrict her activities. She works and spends a lot of time on her feet. Discussed one other option to restrict her without the procedure and that would be efficacious, she says even when she is away from work for a week or the weekend her symptoms persist. We will proceed accordingly.  Plan microlumbar decompression L3-4    Jaquille Kau M. for Dr. Shelle Iron 03/02/2013, 8:51 AM

## 2013-03-04 NOTE — Interval H&P Note (Signed)
History and Physical Interval Note:  03/04/2013 8:25 AM  Betty Garrett  has presented today for surgery, with the diagnosis of HNP AND STENOSIS L3-L4   The various methods of treatment have been discussed with the patient and family. After consideration of risks, benefits and other options for treatment, the patient has consented to  Procedure(s): MICRO LUMBAR DECOMPRESSION  L3-L4  (N/A) as a surgical intervention .  The patient's history has been reviewed, patient examined, no change in status, stable for surgery.  I have reviewed the patient's chart and labs.  Questions were answered to the patient's satisfaction.     Kendyn Zaman C

## 2013-03-04 NOTE — Transfer of Care (Signed)
Immediate Anesthesia Transfer of Care Note  Patient: Betty Garrett  Procedure(s) Performed: Procedure(s) (LRB): MICRO LUMBAR DECOMPRESSION  L3-L4  (N/A)  Patient Location: PACU  Anesthesia Type: General  Level of Consciousness: sedated, patient cooperative and responds to stimulaton  Airway & Oxygen Therapy: Patient Spontanous Breathing and Patient connected to face mask oxgen  Post-op Assessment: Report given to PACU RN and Post -op Vital signs reviewed and stable  Post vital signs: Reviewed and stable  Complications: No apparent anesthesia complications

## 2013-03-04 NOTE — Brief Op Note (Signed)
03/04/2013  10:25 AM  PATIENT:  Betty Garrett  57 y.o. female  PRE-OPERATIVE DIAGNOSIS:  HNP AND STENOSIS L3-L4   POST-OPERATIVE DIAGNOSIS:  HNP AND STENOSIS L3-L4   PROCEDURE:  Procedure(s): MICRO LUMBAR DECOMPRESSION  L3-L4  (N/A)  SURGEON:  Surgeon(s) and Role:    * Javier Docker, MD - Primary  PHYSICIAN ASSISTANT:   ASSISTANTS: Dawayne Cirri ANESTHESIA:   general  EBL:  Total I/O In: 1000 [I.V.:1000] Out: -   BLOOD ADMINISTERED:none  DRAINS: none   LOCAL MEDICATIONS USED:  MARCAINE     SPECIMEN:  No Specimen  DISPOSITION OF SPECIMEN:  N/A  COUNTS:  YES  TOURNIQUET:  * No tourniquets in log *  DICTATION: .Other Dictation: Dictation Number [id number]  PLAN OF CARE: Admit for overnight observation  PATIENT DISPOSITION:  PACU - hemodynamically stable.   Delay start of Pharmacological VTE agent (>24hrs) due to surgical blood loss or risk of bleeding: yes

## 2013-03-04 NOTE — Anesthesia Preprocedure Evaluation (Addendum)
Anesthesia Evaluation  Patient identified by MRN, date of birth, ID band Patient awake    Reviewed: Allergy & Precautions, H&P , NPO status , Patient's Chart, lab work & pertinent test results, reviewed documented beta blocker date and time   Airway Mallampati: II TM Distance: >3 FB Neck ROM: Full    Dental  (+) Dental Advisory Given and Teeth Intact   Pulmonary neg pulmonary ROS,  breath sounds clear to auscultation        Cardiovascular hypertension, Pt. on medications and Pt. on home beta blockers Rhythm:Regular Rate:Normal     Neuro/Psych  Headaches, negative psych ROS   GI/Hepatic negative GI ROS, Neg liver ROS,   Endo/Other  Morbid obesity  Renal/GU negative Renal ROS     Musculoskeletal negative musculoskeletal ROS (+)   Abdominal (+) + obese,   Peds  Hematology negative hematology ROS (+)   Anesthesia Other Findings   Reproductive/Obstetrics negative OB ROS                          Anesthesia Physical Anesthesia Plan  ASA: III  Anesthesia Plan: General   Post-op Pain Management:    Induction: Intravenous  Airway Management Planned: Oral ETT  Additional Equipment:   Intra-op Plan:   Post-operative Plan: Extubation in OR  Informed Consent: I have reviewed the patients History and Physical, chart, labs and discussed the procedure including the risks, benefits and alternatives for the proposed anesthesia with the patient or authorized representative who has indicated his/her understanding and acceptance.   Dental advisory given  Plan Discussed with: CRNA  Anesthesia Plan Comments:         Anesthesia Quick Evaluation

## 2013-03-04 NOTE — Preoperative (Signed)
Beta Blockers   Reason not to administer Beta Blockers:Not Applicable Pt took Beta Blocker today 

## 2013-03-05 ENCOUNTER — Encounter (HOSPITAL_COMMUNITY): Payer: Self-pay | Admitting: Specialist

## 2013-03-05 NOTE — Discharge Summary (Signed)
Physician Discharge Summary   Patient ID: Betty Garrett MRN: [id number] DOB/AGE: Mar 13, 1956 57 y.o.  Admit date: 03/04/2013 Discharge date: 03/05/2013  Primary Diagnosis:   HNP AND STENOSIS L3-L4   Admission Diagnoses:  Past Medical History  Diagnosis Date  . Hypertension   . Headache    Discharge Diagnoses:   Principal Problem:   HNP (herniated nucleus pulposus), lumbar  Procedure:  Procedure(s) (LRB): MICRO LUMBAR DECOMPRESSION  L3-L4  (N/A)   Consults: None  HPI:  see H&P    Laboratory Data: Hospital Outpatient Visit on 02/24/2013  Component Date Value Range Status  . MRSA, PCR 02/24/2013 NEGATIVE  NEGATIVE Final  . Staphylococcus aureus 02/24/2013 NEGATIVE  NEGATIVE Final   Comment:                                 The Xpert SA Assay (FDA                          approved for NASAL specimens                          in patients over 21 years of age),                          is one component of                          a comprehensive surveillance                          program.  Test performance has                          been validated by [employer] for patients greater                          than or equal to 33 year old.                          It is not intended                          to diagnose infection nor to                          guide or monitor treatment.  . Sodium 02/24/2013 140  135 - 145 mEq/L Final  . Potassium 02/24/2013 4.1  3.5 - 5.1 mEq/L Final  . Chloride 02/24/2013 106  96 - 112 mEq/L Final  . CO2 02/24/2013 25  19 - 32 mEq/L Final  . Glucose, Bld 02/24/2013 113* 70 - 99 mg/dL Final  . BUN 40/98/1191 8  6 - 23 mg/dL Final  . Creatinine, Ser 02/24/2013 0.67  0.50 - 1.10 mg/dL Final  . Calcium 47/82/9562 9.6  8.4 - 10.5 mg/dL Final  . GFR calc non Af Amer 02/24/2013 >90  >90 mL/min Final  . GFR calc Af Amer 02/24/2013 >90  >90 mL/min  Final   Comment:                                 The eGFR has  been calculated                          using the CKD EPI equation.                          This calculation has not been                          validated in all clinical                          situations.                          eGFR's persistently                          <90 mL/min signify                          possible Chronic Kidney Disease.  . WBC 02/24/2013 7.5  4.0 - 10.5 K/uL Final  . RBC 02/24/2013 4.98  3.87 - 5.11 MIL/uL Final  . Hemoglobin 02/24/2013 13.6  12.0 - 15.0 g/dL Final  . HCT 82/95/6213 41.2  36.0 - 46.0 % Final  . MCV 02/24/2013 82.7  78.0 - 100.0 fL Final  . MCH 02/24/2013 27.3  26.0 - 34.0 pg Final  . MCHC 02/24/2013 33.0  30.0 - 36.0 g/dL Final  . RDW 08/65/7846 13.3  11.5 - 15.5 % Final  . Platelets 02/24/2013 266  150 - 400 K/uL Final   No results found for this basename: HGB,  in the last 72 hours No results found for this basename: WBC, RBC, HCT, PLT,  in the last 72 hours No results found for this basename: NA, K, CL, CO2, BUN, CREATININE, GLUCOSE, CALCIUM,  in the last 72 hours No results found for this basename: LABPT, INR,  in the last 72 hours  X-Rays:Dg Chest 2 View  02/24/2013   *RADIOLOGY REPORT*  Clinical Data: Preoperative for lumbar spine surgery at L3-L4, history hypertension  CHEST - 2 VIEW  Comparison: 09/28/2010  Findings: Upper normal heart size. Probable prominent fat pad at cardiac apex, unchanged. Mediastinal contours and pulmonary vascularity otherwise normal. Minimal linear scarring lingula. Lungs otherwise clear. No pleural effusion or pneumothorax. No acute osseous findings.  IMPRESSION: Minimal lingular scarring.   Original Report Authenticated By: Ulyses Southward, M.D.   Dg Lumbar Spine 2-3 Views  02/24/2013   *RADIOLOGY REPORT*  Clinical Data: Preoperative evaluation for lumbar microdiskectomy at L3-L4  LUMBAR SPINE - 2-3 VIEW  Comparison: Lumbar CT myelogram12/13/2013  Findings: Prior CT myelogram labeled with last independent  vertebra designated as L5 and last open interspace therefore L5-S1. Current radiographs labeled similarly.  Hypoplastic last rib pair. Bones appear diffusely demineralized. Disc space narrowing L1-L2. Vertebral body heights maintained without fracture or subluxation.  IMPRESSION: Osseous demineralization with minimal disc space narrowing of L1- L2.   Original Report Authenticated By: Ulyses Southward, M.D.   Dg Spine Portable 1 View  03/04/2013   *RADIOLOGY REPORT*  Clinical Data: Surgical level L3-L4.  PORTABLE SPINE - 1 VIEW  Comparison: 03/12/2013.  Findings: A single lateral view of the lumbar spine demonstrates soft tissue retractors in place posterior to the L3-L4 interspace and L4.  There are surgical probes in place immediately posterior to the inferior endplate of L3, L3-L4 interspace and the superior endplate of L4.  IMPRESSION: 1.  Intraoperative localization, as above.   Original Report Authenticated By: Trudie Reed, M.D.   Dg Spine Portable 1 View  03/04/2013   *RADIOLOGY REPORT*  Clinical Data: Lumbar laminectomy.  L3-L4 level.  PORTABLE SPINE - 1 VIEW  Comparison: Earlier in the day at 0852 hours.  Findings: Single lateral view, labeled portable #2 and 0907 hours. This demonstrates a surgical device projecting posterior to the L4 vertebral body.  IMPRESSION: Intraoperative localization of L4.   Original Report Authenticated By: Jeronimo Greaves, M.D.   Dg Spine Portable 1 View  03/04/2013   *RADIOLOGY REPORT*  Clinical Data: Lumbar laminectomy.  L3-L4.  PORTABLE SPINE - 1 VIEW  Comparison: 02/24/2013  Findings: Single lateral view labeled 0852 hours.  Surgical devices project posterior to the L4 and L2 vertebral bodies.  IMPRESSION: Intraoperative localization of L2 and L4.   Original Report Authenticated By: Jeronimo Greaves, M.D.    EKG:No orders found for this or any previous visit.   Hospital Course: Patient was admitted to [facility] and taken to the OR and underwent the above state  procedure without complications.  Patient tolerated the procedure well and was later transferred to the recovery room and then to the orthopaedic floor for postoperative care.  They were given PO and IV analgesics for pain control following their surgery.  They were given 24 hours of postoperative antibiotics.   PT was consulted postop to assist with mobility and transfers.  The patient was allowed to be WBAT with therapy and was taught back precautions. Discharge planning was consulted to help with postop disposition and equipment needs.  Patient had a good night on the evening of surgery and started to get up OOB with therapy on day one. Patient was seen in rounds and was ready to go home on day one.  They were given discharge instructions and dressing directions.  They were instructed on when to follow up in the office with Dr. Shelle Iron.  Discharge Medications: Prior to Admission medications   Medication Sig Start Date End Date Taking? Authorizing Provider  acetaminophen (TYLENOL) 500 MG tablet Take 500 mg by mouth every 6 (six) hours as needed for pain.   Yes Historical Provider, MD  carvedilol (COREG) 6.25 MG tablet Take 6.25 mg by mouth 2 (two) times daily with a meal.   Yes Historical Provider, MD  alendronate (FOSAMAX) 35 MG tablet Take 35 mg by mouth every Saturday. Take with a full glass of water on an empty stomach.    Historical Provider, MD  HYDROcodone-acetaminophen (NORCO) 7.5-325 MG per tablet Take 1-2 tablets by mouth every 4 (four) hours as needed for pain. 03/04/13   Javier Docker, MD  methocarbamol (ROBAXIN) 500 MG tablet Take 1 tablet (500 mg total) by mouth 3 (three) times daily. 03/04/13   Javier Docker, MD  Vitamin D, Ergocalciferol, (DRISDOL) 50000 UNITS CAPS Take 50,000 Units by mouth every Saturday.    Historical Provider, MD    Diet: Regular diet Activity:WBAT Follow-up:in 10-14 days Disposition - Home Discharged Condition: good   Discharge Orders   Future Orders  Complete By Expires  Call MD / Call 911  As directed     Comments:      If you experience chest pain or shortness of breath, CALL 911 and be transported to the hospital emergency room.  If you develope a fever above 101 F, pus (white drainage) or increased drainage or redness at the wound, or calf pain, call your surgeon's office.    Constipation Prevention  As directed     Comments:      Drink plenty of fluids.  Prune juice may be helpful.  You may use a stool softener, such as Colace (over the counter) 100 mg twice a day.  Use MiraLax (over the counter) for constipation as needed.    Diet - low sodium heart healthy  As directed     Increase activity slowly as tolerated  As directed         Medication List    STOP taking these medications       HYDROcodone-acetaminophen 5-325 MG per tablet  Commonly known as:  NORCO/VICODIN      TAKE these medications       acetaminophen 500 MG tablet  Commonly known as:  TYLENOL  Take 500 mg by mouth every 6 (six) hours as needed for pain.     alendronate 35 MG tablet  Commonly known as:  FOSAMAX  Take 35 mg by mouth every Saturday. Take with a full glass of water on an empty stomach.     carvedilol 6.25 MG tablet  Commonly known as:  COREG  Take 6.25 mg by mouth 2 (two) times daily with a meal.     HYDROcodone-acetaminophen 7.5-325 MG per tablet  Commonly known as:  NORCO  Take 1-2 tablets by mouth every 4 (four) hours as needed for pain.     methocarbamol 500 MG tablet  Commonly known as:  ROBAXIN  Take 1 tablet (500 mg total) by mouth 3 (three) times daily.     Vitamin D (Ergocalciferol) 50000 UNITS Caps  Commonly known as:  DRISDOL  Take 50,000 Units by mouth every Saturday.           Follow-up Information   Follow up with BEANE,JEFFREY C, MD In 2 weeks.   Contact information:   [address] [place] [address] [phone]       Signed: Dorothy Spark. 03/05/2013, 7:37 AM

## 2013-03-05 NOTE — Evaluation (Signed)
Occupational Therapy Evaluation Patient Details Name: Betty Garrett MRN: [id number] DOB: 1956/02/26 Today's Date: 03/05/2013 Time: 8657-8469 OT Time Calculation (min): 8 min  OT Assessment / Plan / Recommendation Clinical Impression  This 57 year old female is s/p L3-4 decompression. She has a h/o previous back surgery and husband assisted with adls in the past.  Reviewed back precaution education related to adls and bathroom transfers.  Pt does not need any further OT at this time.      OT Assessment  Patient does not need any further OT services    Follow Up Recommendations  No OT follow up    Barriers to Discharge      Equipment Recommendations  None recommended by OT    Recommendations for Other Services    Frequency       Precautions / Restrictions Precautions Precautions: Back Precaution Booklet Issued: Yes (comment) Restrictions Weight Bearing Restrictions: No   Pertinent Vitals/Pain 5/10 back.  Pt was premedicated and sitting in chair.  Pain initially 7 but decreased with resting in chair    ADL  Transfers/Ambulation Related to ADLs: Pt had just finished working with PT and moves with min guard A.  She verbalizes understanding of keeping back straight during bed mobility and sit - stand transitions ADL Comments: Husband has assisted with LB ADLs.  Pt does not have any AE and will continue to have husband assist.  (Max A for LB adls).  Husband will guard pt as she steps into walk in shower.  Reviewed back precautions during adls--especially for brushing teeth and toilet hygiene.  Pt verbalizes understanding    OT Diagnosis:    OT Problem List:   OT Treatment Interventions:     OT Goals    Visit Information  Last OT Received On: 03/05/13 Assistance Needed: +1    Subjective Data  Subjective: I had back surgery 2 1/2 years ago.   Patient Stated Goal: none stated   Prior Functioning     Home Living Lives With: Spouse Available Help at Discharge:  Family Type of Home: House Home Access: Stairs to enter Secretary/administrator of Steps: 7+3 Entrance Stairs-Rails: None Home Layout: One level Bathroom Shower/Tub: Health visitor: Standard Additional Comments: Pt feels she will be fine with standard commode; same house as when she had sx last time Prior Function Level of Independence: Independent Able to Take Stairs?: Yes Driving: Yes Communication Communication: No difficulties         Vision/Perception     Cognition  Cognition Arousal/Alertness: Awake/alert Behavior During Therapy: WFL for tasks assessed/performed Overall Cognitive Status: Within Functional Limits for tasks assessed    Extremity/Trunk Assessment Right Upper Extremity Assessment RUE ROM/Strength/Tone: [facility] for tasks assessed Left Upper Extremity Assessment LUE ROM/Strength/Tone: WFL for tasks assessed   Mobility      Exercise     Balance     End of Session OT - End of Session Patient left: in chair;with call bell/phone within reach;with family/visitor present  GO Functional Assessment Tool Used: clinical judgment Functional Limitation: Self care Self Care Current Status (G2952): At least 60 percent but less than 80 percent impaired, limited or restricted Self Care Goal Status (W4132): At least 60 percent but less than 80 percent impaired, limited or restricted Self Care Discharge Status [phone]): At least 60 percent but less than 80 percent impaired, limited or restricted   [facility] 03/05/2013, 9:08 AM Marica Otter, OTR/L [phone] 03/05/2013

## 2013-03-05 NOTE — Care Management Note (Signed)
    Page 1 of 1   03/05/2013     11:45:19 AM   CARE MANAGEMENT NOTE 03/05/2013  Patient:  MERIAH, SHANDS   Account Number:  [id number]  Date Initiated:  03/05/2013  Documentation initiated by:  Colleen Can  Subjective/Objective Assessment:   dx HNP, spinal stenosis  L4-4; microlumbar decopressionh L3-4 left     Action/Plan:   CM spoke with patient. Plans are for patient to return to her home in [place], [place] where spouse will be caregiver. She already has DME if needed.   Anticipated DC Date:  03/05/2013   Anticipated DC Plan:  HOME/SELF CARE      DC Planning Services  CM consult      Choice offered to / List presented to:             Status of service:  Completed, signed off Medicare Important Message given?   (If response is "NO", the following Medicare IM given date fields will be blank) Date Medicare IM given:   Date Additional Medicare IM given:    Discharge Disposition:  HOME/SELF CARE  Per UR Regulation:  Reviewed for med. necessity/level of care/duration of stay  If discussed at Long Length of Stay Meetings, dates discussed:    Comments:

## 2013-03-05 NOTE — Progress Notes (Signed)
Subjective: 1 Day Post-Op Procedure(s) (LRB): MICRO LUMBAR DECOMPRESSION  L3-L4  (N/A) Patient reports pain as mild.  C/o incisional back pain, controlled on pain medication. Leg pain improved. Getting OOB on own to bathroom without difficulty. Voiding without difficulty. Denies numbness or tingling.  Objective: Vital signs in last 24 hours: Temp:  [97.6 F (36.4 C)-98.8 F (37.1 C)] 97.6 F (36.4 C) (05/22 1610) Pulse Rate:  [66-85] 85 (05/22 0638) Resp:  [10-18] 16 (05/22 0638) BP: (111-147)/(59-87) 125/74 mmHg (05/22 0638) SpO2:  [94 %-98 %] 98 % (05/22 9604) Weight:  [91.264 kg (201 lb 3.2 oz)] 91.264 kg (201 lb 3.2 oz) (05/21 1244)  Intake/Output from previous day: 05/21 0701 - 05/22 0700 In: 2635 [P.O.:480; I.V.:2105; IV Piggyback:50] Out: 300 [Urine:300] Intake/Output this shift:    No results found for this basename: HGB,  in the last 72 hours No results found for this basename: WBC, RBC, HCT, PLT,  in the last 72 hours No results found for this basename: NA, K, CL, CO2, BUN, CREATININE, GLUCOSE, CALCIUM,  in the last 72 hours No results found for this basename: LABPT, INR,  in the last 72 hours  Neurologically intact ABD soft Neurovascular intact Sensation intact distally Intact pulses distally Dorsiflexion/Plantar flexion intact Incision: dressing C/D/I and no drainage No cellulitis present Compartment soft No calf pain or sign of DVT  Assessment/Plan: 1 Day Post-Op Procedure(s) (LRB): MICRO LUMBAR DECOMPRESSION  L3-L4  (N/A) Advance diet Up with therapy D/C IV fluids PT for ambulation this AM prior to D/C Discussed D/C instructions Will discuss with Dr. Shelle Iron Follow up in 10-14 days for suture removal  Dovey Fatzinger M. 03/05/2013, 7:33 AM

## 2013-03-05 NOTE — Evaluation (Signed)
Physical Therapy Evaluation Patient Details Name: Betty Garrett MRN: [id number] DOB: [birth year] Today's Date: 03/05/2013 Time: 4098-1191 PT Time Calculation (min): 23 min  PT Assessment / Plan / Recommendation Clinical Impression  Pt s/p L3-4 lumbar decompress and microdiscectomy presents with post op pain and back precautions limiting functional mobility.     PT Assessment  Patient needs continued PT services    Follow Up Recommendations  No PT follow up    Does the patient have the potential to tolerate intense rehabilitation      Barriers to Discharge None      Equipment Recommendations  None recommended by PT    Recommendations for Other Services OT consult   Frequency 7X/week    Precautions / Restrictions Precautions Precautions: Back Precaution Booklet Issued: Yes (comment) Restrictions Weight Bearing Restrictions: No   Pertinent Vitals/Pain 8/10 premed      Mobility  Bed Mobility Bed Mobility: Rolling Right;Supine to Sit Rolling Right: 4: Min guard;5: Supervision Details for Bed Mobility Assistance: min cues for log roll technique Transfers Transfers: Sit to Stand;Stand to Sit Sit to Stand: 4: Min guard Stand to Sit: 4: Min guard Details for Transfer Assistance: cues for use of UEs to self assist and maintenance of erect spine with transfers Ambulation/Gait Ambulation/Gait Assistance: 4: Min guard;5: Supervision Ambulation Distance (Feet): 180 Feet (70 ft wtih RW and 110 without) Assistive device: Rolling walker Ambulation/Gait Assistance Details: Pt prefers amb without RW and demonstrates good stability Gait Pattern: Step-through pattern Stairs: No (Reviewed verbally - deferred 2* increasing pain level)    Exercises     PT Diagnosis: Difficulty walking  PT Problem List: Decreased activity tolerance;Decreased mobility;Decreased knowledge of use of DME;Obesity;Pain;Decreased knowledge of precautions PT Treatment Interventions: DME instruction;Gait  training;Stair training;Functional mobility training;Therapeutic activities;Patient/family education   PT Goals Acute Rehab PT Goals PT Goal Formulation: With patient Time For Goal Achievement: 03/07/13 Potential to Achieve Goals: Good Pt will go Supine/Side to Sit: with supervision PT Goal: Supine/Side to Sit - Progress: Goal set today Pt will go Sit to Supine/Side: with supervision PT Goal: Sit to Supine/Side - Progress: Goal set today Pt will go Sit to Stand: with supervision PT Goal: Sit to Stand - Progress: Goal set today Pt will go Stand to Sit: with supervision PT Goal: Stand to Sit - Progress: Goal set today Pt will Ambulate: >150 feet;with supervision PT Goal: Ambulate - Progress: Goal set today Pt will Go Up / Down Stairs: 6-9 stairs;with min assist PT Goal: Up/Down Stairs - Progress: Goal set today  Visit Information  Last PT Received On: 03/05/13 Assistance Needed: +1    Subjective Data  Subjective: I had back surgery about 2.5 yrs ago Patient Stated Goal: Resume previous lifestyle with decreased pain   Prior Functioning  Home Living Lives With: Spouse Available Help at Discharge: Family Type of Home: House Home Access: Stairs to enter Secretary/administrator of Steps: 7+3 Entrance Stairs-Rails: None Home Layout: One level Additional Comments: Pt states can borrow RW from sister if needed Prior Function Level of Independence: Independent Able to Take Stairs?: Yes Driving: Yes Communication Communication: No difficulties    Cognition  Cognition Arousal/Alertness: Awake/alert Behavior During Therapy: WFL for tasks assessed/performed Overall Cognitive Status: Within Functional Limits for tasks assessed    Extremity/Trunk Assessment Right Upper Extremity Assessment RUE ROM/Strength/Tone: [facility] for tasks assessed Left Upper Extremity Assessment LUE ROM/Strength/Tone: [facility] for tasks assessed Right Lower Extremity Assessment RLE ROM/Strength/Tone: [facility] for  tasks assessed Left Lower Extremity Assessment LLE  ROM/Strength/Tone: [facility] for tasks assessed   Balance    End of Session PT - End of Session Activity Tolerance: Patient tolerated treatment well Patient left: in chair;with call bell/phone within reach Nurse Communication: Mobility status  GP Functional Assessment Tool Used: clinical judgement Functional Limitation: Mobility: Walking and moving around Mobility: Walking and Moving Around Current Status (Z6109): At least 20 percent but less than 40 percent impaired, limited or restricted Mobility: Walking and Moving Around Goal Status [phone]): At least 1 percent but less than 20 percent impaired, limited or restricted   [facility] 03/05/2013, 8:43 AM

## 2014-05-22 IMAGING — CR DG LUMBAR SPINE 2-3V
2 series · 2 of 2 positions shown · non-contrast
Comparison: Lumbar CT myelogram09/26/2012

CLINICAL DATA: Preoperative evaluation for lumbar microdiskectomy
at L3-L4

LUMBAR SPINE - 2-3 VIEW

[t l-spine a.p.]
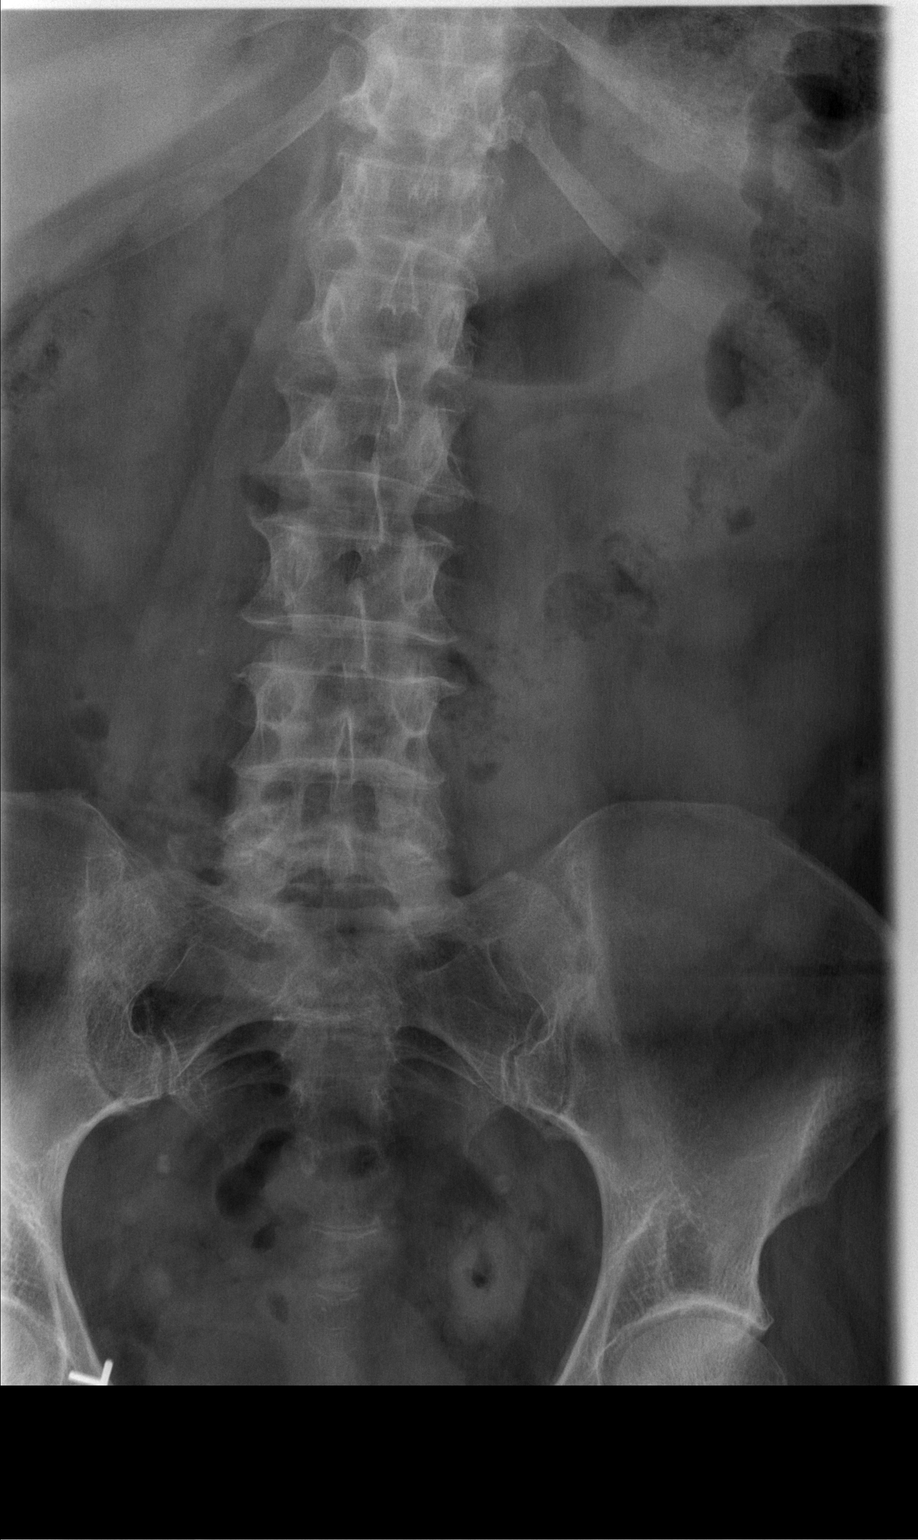

[t l-spine lat]
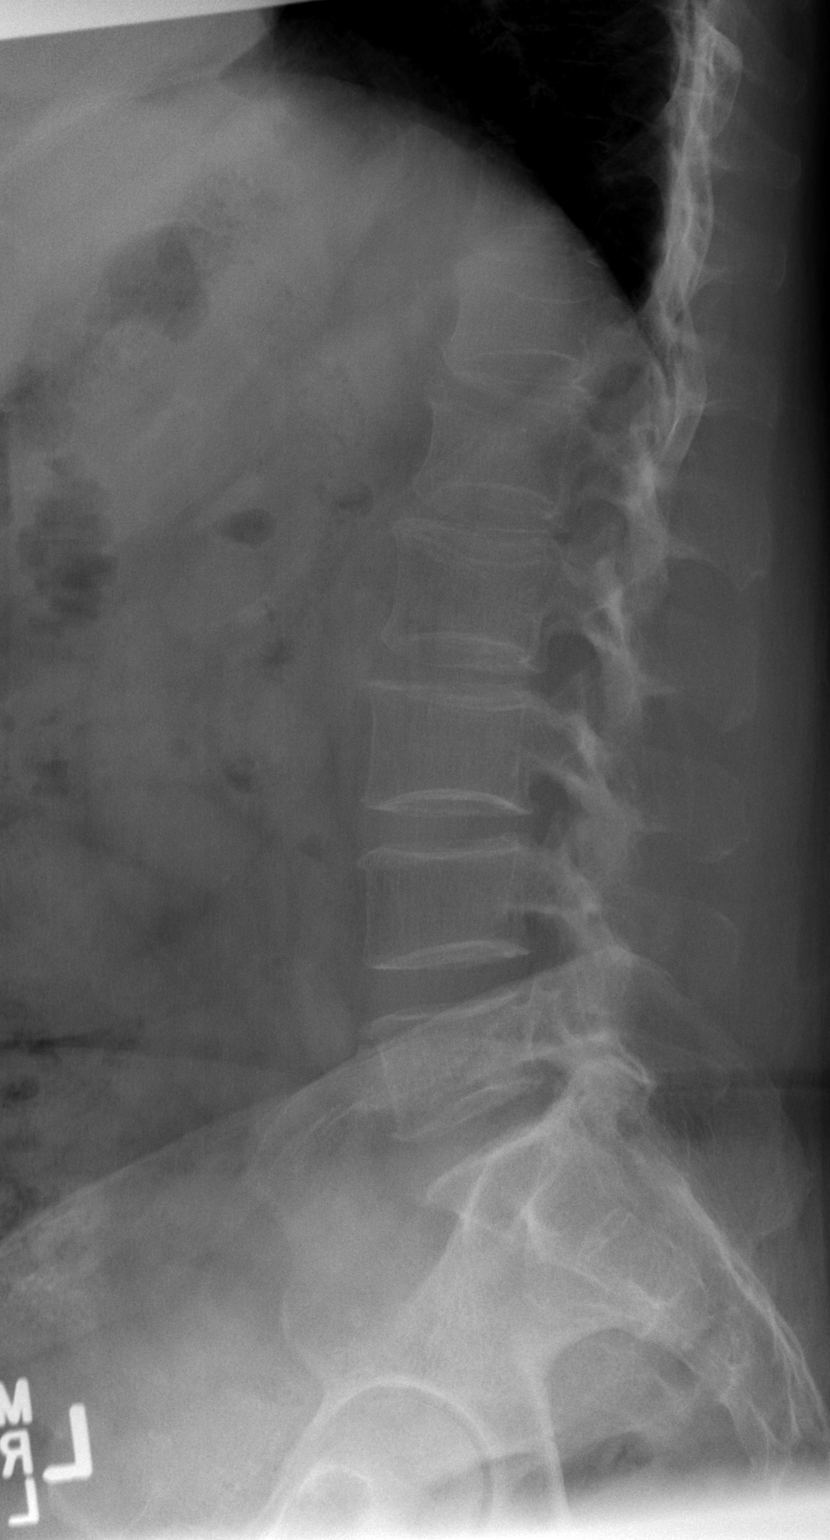

[2 of 2 positions shown; findings below may reference images not displayed]

FINDINGS: Prior CT myelogram labeled with last independent vertebra
designated as L5 and last open interspace therefore L5-S1.
Current radiographs labeled similarly.

Hypoplastic last rib pair.
Bones appear diffusely demineralized.
Disc space narrowing L1-L2.
Vertebral body heights maintained without fracture or subluxation.
IMPRESSION: Osseous demineralization with minimal disc space narrowing of L1-
L2.

## 2014-05-30 IMAGING — CR DG SPINE 1V PORT
1 series · 1 of 1 positions shown · non-contrast
Comparison: 02/24/2013

CLINICAL DATA: Lumbar laminectomy.  L3-L4.

PORTABLE SPINE - 1 VIEW

[lateral]
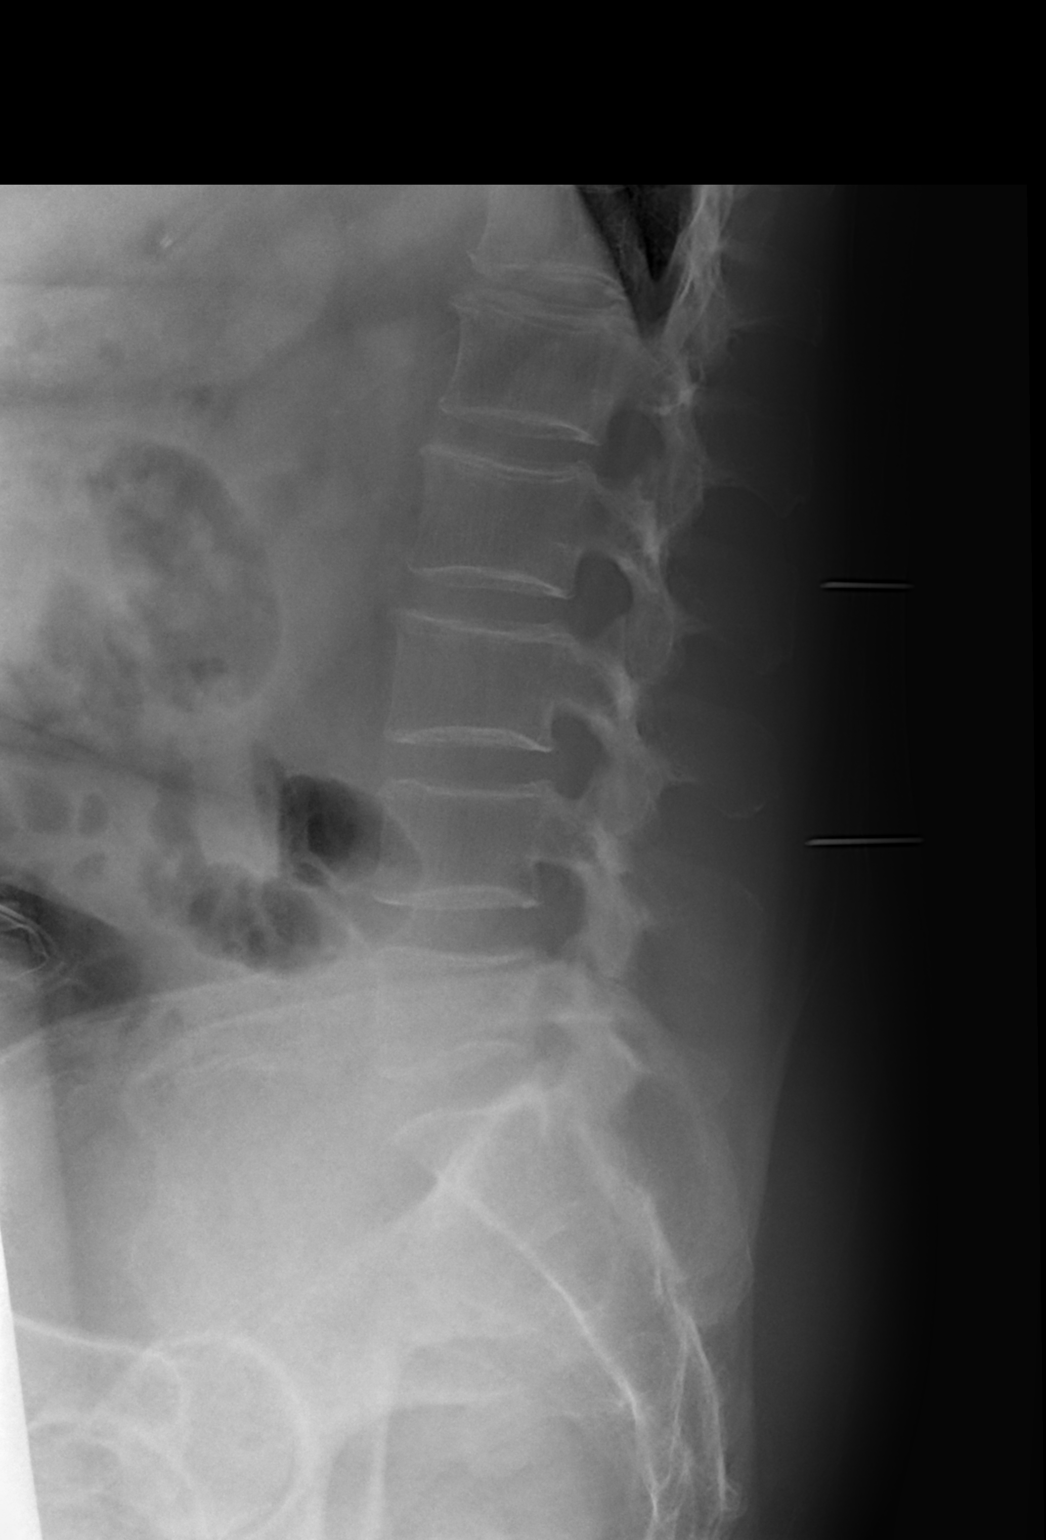

[1 of 1 positions shown; findings below may reference images not displayed]

FINDINGS: Single lateral view labeled 1060 hours.  Surgical devices
project posterior to the L4 and L2 vertebral bodies.
IMPRESSION: Intraoperative localization of L2 and L4.

## 2021-12-21 ENCOUNTER — Ambulatory Visit: Payer: Self-pay

## 2021-12-21 NOTE — Telephone Encounter (Signed)
?  Chief Complaint: side pain related to fall ?Symptoms: R sided pain that radiates around to under R breast and back pain ?Frequency: 2 weeks ago when fall occurred ?Pertinent Negatives: Na ?Disposition: [] ED /[x] Urgent Care (no appt availability in office) / [] Appointment(In office/virtual)/ []  [facility]/ [] Home Care/ [] Refused Recommended Disposition /[] [facility]/ []  Follow-up with PCP ?Additional Notes: advised pt to call PCP and reach out to see if they can see her or she can go to UC. Pt was asking about UC locations and provided her with the locations of UC in [place].  ? ? ?Reason for Disposition ? [1] SEVERE back pain (e.g., excruciating, unable to do any normal activities) AND [2] not improved 2 hours after pain medicine ? ?Answer Assessment - Initial Assessment Questions ?1. ONSET: "When did the pain begin?"  ?    2 week s ?2. LOCATION: "Where does it hurt?" (upper, mid or lower back) ?    R side of back and under R breast going down  ?3. SEVERITY: "How bad is the pain?"  (e.g., Scale 1-10; mild, moderate, or severe) ?  - MILD (1-3): doesn't interfere with normal activities  ?  - MODERATE (4-7): interferes with normal activities or awakens from sleep  ?  - SEVERE (8-10): excruciating pain, unable to do any normal activities  ?    At times 10 ?4. PATTERN: "Is the pain constant?" (e.g., yes, no; constant, intermittent)  ?    Comes and goes depending on position ?6. CAUSE:  "What do you think is causing the back pain?"  ?    Fall 2 week  ?8. MEDICATIONS: "What have you taken so far for the pain?" (e.g., nothing, acetaminophen, NSAIDS) ?    Tylenol ?10. OTHER SYMPTOMS: "Do you have any other symptoms?" (e.g., fever, abdominal pain, burning with urination, blood in urine) ?      No ? ?Protocols used: Back Pain-A-AH ? ?

## 2024-10-12 NOTE — Progress Notes (Signed)
 10/12/2024  Betty Garrett  [id number]  Ole Mannie Pepper, FNP   Chief complaint:  Patient is here for the following Follow-up (Patient is here for follow up for polyneuropathy and gait difficulty with right leg. ) .  HPI: History of Present Illness The patient presents for evaluation of neuropathic pain in her feet, lower back pain, and right knee pain.  Neuropathic Pain in Feet She has neuropathic pain in her feet and a history of lower back pain. She has been on venlafaxine for nerve pain. She has not tried Lyrica but has tried gabapentin (caused excessive sleepiness), nortriptyline (caused drowsiness), and Topamax. She had an EMG a few years ago. Qutenza patches were ineffective for foot pain. - Character: Neuropathic pain. - Alleviating/Aggravating Factors: Venlafaxine for nerve pain; gabapentin caused excessive sleepiness; nortriptyline caused drowsiness; Qutenza patches were ineffective.  Lower Back Pain She is under Dr. Dodson care at [facility] for back pain and has had two back surgeries. Current pain is similar but more intense. - Character: Similar to previous pain but more intense. - Severity: More intense than previous pain.  Right Knee Pain She has intermittent shooting pain in her right leg, from ankle to hip and gluteal area, described as burning. It worsens with ambulation and persists at rest, localized to the posterior leg. This new symptom intensifies with knee flexion. No bowel or bladder issues. - Onset: New symptom. - Location: Right leg, from ankle to hip and gluteal area, localized to the posterior leg. - Character: Intermittent shooting pain, described as burning. - Alleviating/Aggravating Factors: Worsens with ambulation and persists at rest; intensifies with knee flexion.  PAST SURGICAL HISTORY: Two back surgeries  MEDICATIONS CURRENT MEDS: Venlafaxine  PREVIOUS MEDS: Qutenza (Not effective) Gabapentin (Caused excessive sleepiness) Nortriptyline  (Caused excessive sleepiness) Topamax Hydrocodone  Prednisone   Problem List[1]  Current Medications[2]  Tobacco Use: Medium Risk (10/12/2024)   Patient History    Smoking Tobacco Use: Never    Smokeless Tobacco Use: Never    Passive Exposure: Current    REVIEW OF SYSTEMS:   Review of Systems  Vitals:   10/12/24 0846  BP: 152/84  Pulse: 68  SpO2: 93%   Body mass index is 44.41 kg/m.  Pain Assessment Pain Score  : 10 Pain Type: Chronic pain Pain Location: Leg Pain Orientation: Right     PHYSICAL EXAM:   GEN: Well appearing, no apparent distress  NECK: supple  SKIN: Normal.  NEURO:  Alert and Oriented times three, Normal speech and language. Patient gives a coherent history and has a normal train of thought. Recent and remote memory appear to be normal.  Normal attention and concentration. CRANIAL NERVES:  Extraocular motions are intact no diplopia.  Normal facial symmetry and sensation.  Uvula and palate upward and midline.  Sensory normal to light touch throughout  Motor moves all extremities well, no abnormal movements. No evidence of tremor.  GAIT: Normal walking. Gait is normal not wide-based.    Assessment & Plan 1. Peripheral neuropathy: Chronic.  Nerve conduction studies done 9/22 did not show a large fiber neuropathy we considered a possible small fiber neuropathy she is not responding to the Qutenza patch nor has she responded well to several medications we may want to consider skin biopsy. - Qutenza patch ineffective. - Gradual increase in pregabalin dosage: 25 mg, 50 mg, 75 mg. If tolerated, may increase further. - If pregabalin causes excessive sleepiness, consider lacosamide or Vimpat. - Discussed spinal cord stimulators.  2. Lower back pain:  Chronic.  Patient has had surgery at [facility] so this could be a postlaminectomy syndrome versus a right S1 radiculopathy - Managed with prednisone and hydrocodone . - If no improvement, contact [facility]. - Consider epidural steroid injections, physical therapy, repeat imaging.  3. Right knee pain: Acute. - Order x-ray to rule out abnormalities. - Conduct lab test for uric acid level to check for gout flare-up. - Prescribe prednisone for inflammation. - Discontinue if issues arise.  Follow-up - X-ray of the right knee. - Lab test for uric acid level.   Diagnoses and all orders for this visit:  Diabetic polyneuropathy associated with type 2 diabetes mellitus    (CMD) -     HYDROcodone -acetaminophen  (NORCO) 5-325 mg per tablet; Take 1 tablet by mouth every 6 (six) hours as needed for moderate pain (4-6). -     pregabalin (Lyrica) 25 mg capsule; Take 1 capsule (25 mg total) by mouth 2 (two) times a day. -     pregabalin (Lyrica) 50 mg capsule; Take 1 capsule (50 mg total) by mouth 2 (two) times a day. -     pregabalin (Lyrica) 75 mg capsule; Take 1 capsule (75 mg total) by mouth 2 (two) times a day. -     XR Knee 4+ Views Right; Future -     Uric Acid; Future -     Sedimentation Rate (ESR); Future  Lumbar radiculopathy -     HYDROcodone -acetaminophen  (NORCO) 5-325 mg per tablet; Take 1 tablet by mouth every 6 (six) hours as needed for moderate pain (4-6). -     pregabalin (Lyrica) 25 mg capsule; Take 1 capsule (25 mg total) by mouth 2 (two) times a day. -     pregabalin (Lyrica) 50 mg capsule; Take 1 capsule (50 mg total) by mouth 2 (two) times a day. -     pregabalin (Lyrica) 75 mg capsule; Take 1 capsule (75 mg total) by mouth 2 (two) times a day. -     XR Knee 4+ Views Right; Future -     Uric Acid; Future -     Sedimentation Rate (ESR); Future  Gout of right knee, unspecified cause, unspecified chronicity -     HYDROcodone -acetaminophen  (NORCO) 5-325 mg per tablet; Take 1 tablet by mouth every 6 (six) hours as needed for moderate pain (4-6). -     pregabalin (Lyrica) 25 mg capsule; Take 1 capsule (25 mg total) by mouth 2 (two) times a day. -     pregabalin (Lyrica) 50 mg  capsule; Take 1 capsule (50 mg total) by mouth 2 (two) times a day. -     pregabalin (Lyrica) 75 mg capsule; Take 1 capsule (75 mg total) by mouth 2 (two) times a day. -     XR Knee 4+ Views Right; Future -     Uric Acid; Future -     Sedimentation Rate (ESR); Future  Other orders -     predniSONE (STERAPRED DS) 10 mg DsPk dose pack; Use As Directed On Package.      Call or go to ER if any acute changes.  Safety and side effects discussed. All question answered.    I agree the documentation is accurate and complete.  Electronically signed by: Camellia ONEIDA Custard, MD 10/12/2024 9:21 AM       [1] Patient Active Problem List Diagnosis   Gastroesophageal reflux disease without esophagitis   Gout   Essential hypertension   Ingrown nail   Pain around toenail, left  foot   Aftercare following surgery   Inguinal hernia   Incisional hernia, without obstruction or gangrene   Severe episode of recurrent major depressive disorder, without psychotic features    (CMD)   Lumbar degenerative disc disease   Lumbar spondylosis   Lumbar radiculopathy   Left sciatic nerve pain   Tingling of both feet   Closed fracture of neck of metatarsal bone of right foot with routine healing   Morbid obesity, unspecified obesity type (CMD)   Low vitamin D level   Closed fracture of metatarsal bone of right foot, initial encounter   Type 2 diabetes mellitus (CMD)   Post-menopause   Diabetic polyneuropathy associated with type 2 diabetes mellitus    (CMD)   Anxiety   Vitamin D deficiency   Class 3 severe obesity due to excess calories with serious comorbidity and body mass index (BMI) of 40.0 to 44.9 in adult (CMD)   Drug-induced constipation  [2]  Current Outpatient Medications:    ascorbic acid (VITAMIN C ORAL), , Disp: , Rfl:    azelastine (ASTELIN) 137 mcg (0.1 %) nasal spray, Administer 1 spray into each nostril 2 (two) times a day. Use in each nostril as directed, Disp:  30 mL, Rfl: 1   carvediloL  (COREG ) 12.5 mg tablet, TAKE 1 TABLET TWICE A DAY WITH MEALS, Disp: 180 tablet, Rfl: 3   cholecalciferol, vitamin D3, (VITAMIN D3 ORAL), , Disp: , Rfl:    cyanocobalamin, vitamin B-12, (VITAMIN B-12 ORAL), , Disp: , Rfl:    cyclobenzaprine (FLEXERIL) 5 mg tablet, Take 1 tablet (5 mg total) by mouth 2 (two) times a day as needed for muscle spasms., Disp: 60 tablet, Rfl: 1   fexofenadine (Allegra Allergy) 180 mg tablet, Take 1 tablet (180 mg total) by mouth daily., Disp: 90 tablet, Rfl: 3   fluticasone propionate (FLONASE) 50 mcg/spray nasal spray, USE 1 SPRAY IN EACH NOSTRIL DAILY, Disp: 32 g, Rfl: 2   glipiZIDE (GLUCOTROL XL) 5 mg 24 hr tablet, Take 1 tablet (5 mg total) by mouth daily., Disp: 90 tablet, Rfl: 3   glucose blood test strip, 1 each by Other route daily. Use as instructed, Disp: 90 each, Rfl: 0   montelukast (SINGULAIR) 10 mg tablet, TAKE 1 TABLET DAILY, Disp: 90 tablet, Rfl: 3   pantoprazole (PROTONIX) 40 mg EC tablet, TAKE 1 TABLET DAILY, Disp: 90 tablet, Rfl: 3   venlafaxine (EFFEXOR XR) 75 mg 24 hr capsule, TAKE 1 CAPSULE DAILY, Disp: 90 capsule, Rfl: 3   ZINC ACETATE ORAL, , Disp: , Rfl:    HYDROcodone -acetaminophen  (NORCO) 5-325 mg per tablet, Take 1 tablet by mouth every 6 (six) hours as needed for moderate pain (4-6)., Disp: 40 tablet, Rfl: 0   predniSONE (STERAPRED DS) 10 mg DsPk dose pack, Use As Directed On Package., Disp: 21 tablet, Rfl: 0   pregabalin (Lyrica) 25 mg capsule, Take 1 capsule (25 mg total) by mouth 2 (two) times a day., Disp: 14 capsule, Rfl: 0   [START ON 10/18/2024] pregabalin (Lyrica) 50 mg capsule, Take 1 capsule (50 mg total) by mouth 2 (two) times a day., Disp: 14 capsule, Rfl: 0   pregabalin (Lyrica) 75 mg capsule, Take 1 capsule (75 mg total) by mouth 2 (two) times a day., Disp: 60 capsule, Rfl: 5  Current Facility-Administered Medications:    capsaicin-skin cleanser (QUTENZA) 8 % topical kit 4 patch, 4  patch, topical, Q3 Months, Camellia Krystal Custard, MD, 4 patch at 07/15/24 1411

## 2024-10-16 ENCOUNTER — Other Ambulatory Visit (HOSPITAL_COMMUNITY): Payer: Self-pay | Admitting: Medical

## 2024-10-16 ENCOUNTER — Ambulatory Visit (HOSPITAL_COMMUNITY)
Admission: RE | Admit: 2024-10-16 | Discharge: 2024-10-16 | Disposition: A | Discharge status: Home / Self Care | Source: Ambulatory Visit | Attending: Vascular Surgery | Admitting: Vascular Surgery

## 2024-10-16 DIAGNOSIS — M7989 Other specified soft tissue disorders: Secondary | ICD-10-CM | POA: Insufficient documentation

## 2024-10-16 DIAGNOSIS — M79604 Pain in right leg: Secondary | ICD-10-CM | POA: Diagnosis present
